# Patient Record
Sex: Male | Born: 1965 | Race: Black or African American | Hispanic: No | Marital: Single | Smoking: Never smoker
Health system: Southern US, Community
[De-identification: ages and names within clinical notes are randomized; demographics above are authoritative.]

## PROBLEM LIST (undated history)

## (undated) DIAGNOSIS — I4892 Unspecified atrial flutter: Secondary | ICD-10-CM

## (undated) DIAGNOSIS — I499 Cardiac arrhythmia, unspecified: Secondary | ICD-10-CM

## (undated) DIAGNOSIS — I10 Essential (primary) hypertension: Secondary | ICD-10-CM

## (undated) HISTORY — PX: WISDOM TOOTH EXTRACTION: SHX21

## (undated) HISTORY — PX: OTHER SURGICAL HISTORY: SHX169

## (undated) HISTORY — DX: Unspecified atrial flutter: I48.92

---

## 2013-08-05 ENCOUNTER — Encounter (HOSPITAL_COMMUNITY): Payer: Self-pay | Admitting: Emergency Medicine

## 2013-08-05 ENCOUNTER — Emergency Department (HOSPITAL_COMMUNITY): Payer: BC Managed Care – PPO

## 2013-08-05 ENCOUNTER — Emergency Department (HOSPITAL_COMMUNITY)
Admission: EM | Admit: 2013-08-05 | Discharge: 2013-08-05 | Disposition: A | Discharge status: Home / Self Care | Payer: BC Managed Care – PPO | Attending: Emergency Medicine | Admitting: Emergency Medicine

## 2013-08-05 DIAGNOSIS — R002 Palpitations: Secondary | ICD-10-CM

## 2013-08-05 DIAGNOSIS — I4892 Unspecified atrial flutter: Secondary | ICD-10-CM

## 2013-08-05 DIAGNOSIS — Z7982 Long term (current) use of aspirin: Secondary | ICD-10-CM | POA: Insufficient documentation

## 2013-08-05 DIAGNOSIS — Z79899 Other long term (current) drug therapy: Secondary | ICD-10-CM | POA: Insufficient documentation

## 2013-08-05 DIAGNOSIS — Z8679 Personal history of other diseases of the circulatory system: Secondary | ICD-10-CM | POA: Insufficient documentation

## 2013-08-05 LAB — BASIC METABOLIC PANEL
BUN: 12 mg/dL (ref 6–23)
CALCIUM: 10.2 mg/dL (ref 8.4–10.5)
CO2: 23 mEq/L (ref 19–32)
Chloride: 99 mEq/L (ref 96–112)
Creatinine, Ser: 0.93 mg/dL (ref 0.50–1.35)
Glucose, Bld: 96 mg/dL (ref 70–99)
POTASSIUM: 3.7 meq/L (ref 3.7–5.3)
SODIUM: 141 meq/L (ref 137–147)

## 2013-08-05 LAB — POCT I-STAT TROPONIN I: TROPONIN I, POC: 0.01 ng/mL (ref 0.00–0.08)

## 2013-08-05 LAB — CBC
HCT: 41.1 % (ref 39.0–52.0)
Hemoglobin: 14.7 g/dL (ref 13.0–17.0)
MCH: 34.7 pg — ABNORMAL HIGH (ref 26.0–34.0)
MCHC: 35.8 g/dL (ref 30.0–36.0)
MCV: 96.9 fL (ref 78.0–100.0)
Platelets: 292 10*3/uL (ref 150–400)
RBC: 4.24 MIL/uL (ref 4.22–5.81)
RDW: 11.6 % (ref 11.5–15.5)
WBC: 7.8 10*3/uL (ref 4.0–10.5)

## 2013-08-05 LAB — PRO B NATRIURETIC PEPTIDE: Pro B Natriuretic peptide (BNP): 394.5 pg/mL — ABNORMAL HIGH (ref 0–125)

## 2013-08-05 NOTE — ED Notes (Signed)
Pt reports hx atrial fibrillation and today he states he felt his heart go back into atrial fib. He checked and his PR was 140. Denies pain, breathing easily, a&ox4

## 2013-08-05 NOTE — ED Provider Notes (Signed)
CSN: [id number]     Arrival date & time 08/05/13  1426 History   First MD Initiated Contact with Patient 08/05/13 1645     Chief Complaint  Patient presents with  . Atrial Fibrillation   (Consider location/radiation/quality/duration/timing/severity/associated sxs/prior Treatment) Patient is a 48 y.o. male presenting with atrial fibrillation. The history is provided by the patient.  Atrial Fibrillation Pertinent negatives include no chest pain, no abdominal pain, no headaches and no shortness of breath.  pt c/o onset palpitations earlier today. Was constant, now improved/resolved. States hx afib, felt same. No current or recent cp or discomfort of any sort. No unusual doe or fatigue. No diaphoresis or nv.  No syncope/fainting. States prior when had afib had seen unspec cardiologist who is now retired, states no specific dx/cause made. No recent heat intolerance, sweats, skin changes, swelling, wt loss, or hx thyroid disease. Denies excessive caffeine or any stimulant use. No cocaine or etoh abuse. States recent health has been at baseline, had been completely asymptomatic prior to onset above symptoms.     Past Medical History  Diagnosis Date  . Atrial fibrillation    History reviewed. No pertinent past surgical history. History reviewed. No pertinent family history. History  Substance Use Topics  . Smoking status: Never Smoker   . Smokeless tobacco: Not on file  . Alcohol Use: Yes    Review of Systems  Constitutional: Negative for fever.  HENT: Negative for sore throat.   Eyes: Negative for redness.  Respiratory: Negative for cough and shortness of breath.   Cardiovascular: Negative for chest pain.  Gastrointestinal: Negative for nausea, vomiting, abdominal pain and diarrhea.  Genitourinary: Negative for dysuria and flank pain.  Musculoskeletal: Negative for back pain and neck pain.  Skin: Negative for rash.  Neurological: Negative for syncope, weakness, numbness and headaches.   Hematological: Does not bruise/bleed easily.  Psychiatric/Behavioral: Negative for confusion.    Allergies  Review of patient's allergies indicates no known allergies.  Home Medications   Current Outpatient Rx  Name  Route  Sig  Dispense  Refill  . Ascorbic Acid (VITAMIN C PO)   Oral   Take 1 capsule by mouth at bedtime.         Marland Kitchen. aspirin EC 81 MG tablet   Oral   Take 81 mg by mouth daily.         . Flaxseed, Linseed, (FLAX SEEDS PO)   Oral   Take 1 tablet by mouth at bedtime.         Marland Kitchen. lisinopril-hydrochlorothiazide (PRINZIDE,ZESTORETIC) 10-12.5 MG per tablet   Oral   Take 1 tablet by mouth daily.         . Nebivolol HCl (BYSTOLIC) 20 MG TABS   Oral   Take 20 mg by mouth daily.         . Omega-3 Fatty Acids (FISH OIL PO)   Oral   Take 1 capsule by mouth at bedtime.         . Red Yeast Rice Extract (RED YEAST RICE PO)   Oral   Take 2 tablets by mouth at bedtime.          BP 153/112  Pulse 61  Temp(Src) 97.9 F (36.6 C) (Oral)  Resp 18  Ht 5\' 10"  (1.778 m)  Wt 175 lb (79.379 kg)  BMI 25.11 kg/m2  SpO2 100% Physical Exam  Nursing note and vitals reviewed. Constitutional: He is oriented to person, place, and time. He appears well-developed and well-nourished. No distress.  HENT:  Nose: Nose normal.  Mouth/Throat: Oropharynx is clear and moist.  Eyes: Conjunctivae are normal. No scleral icterus.  Neck: Neck supple. No tracheal deviation present. No thyromegaly present.  Cardiovascular: Normal rate, regular rhythm, normal heart sounds and intact distal pulses.  Exam reveals no gallop and no friction rub.   No murmur heard. Pulmonary/Chest: Effort normal and breath sounds normal. No accessory muscle usage. No respiratory distress.  Abdominal: Soft. Bowel sounds are normal. He exhibits no distension and no mass. There is no tenderness. There is no rebound and no guarding.  Musculoskeletal: Normal range of motion. He exhibits no edema and no  tenderness.  Neurological: He is alert and oriented to person, place, and time. No cranial nerve deficit.  Steady gait  Skin: Skin is warm and dry. He is not diaphoretic.  Psychiatric: He has a normal mood and affect.    ED Course  Procedures (including critical care time)  Results for orders placed during the hospital encounter of 08/05/13  PRO B NATRIURETIC PEPTIDE      Result Value Range   Pro B Natriuretic peptide (BNP) 394.5 (*) 0 - 125 pg/mL  BASIC METABOLIC PANEL      Result Value Range   Sodium 141  137 - 147 mEq/L   Potassium 3.7  3.7 - 5.3 mEq/L   Chloride 99  96 - 112 mEq/L   CO2 23  19 - 32 mEq/L   Glucose, Bld 96  70 - 99 mg/dL   BUN 12  6 - 23 mg/dL   Creatinine, Ser 4.09  0.50 - 1.35 mg/dL   Calcium 81.1  8.4 - 91.4 mg/dL   GFR calc non Af Amer >90  >90 mL/min   GFR calc Af Amer >90  >90 mL/min  CBC      Result Value Range   WBC 7.8  4.0 - 10.5 K/uL   RBC 4.24  4.22 - 5.81 MIL/uL   Hemoglobin 14.7  13.0 - 17.0 g/dL   HCT 78.2  95.6 - 21.3 %   MCV 96.9  78.0 - 100.0 fL   MCH 34.7 (*) 26.0 - 34.0 pg   MCHC 35.8  30.0 - 36.0 g/dL   RDW 08.6  57.8 - 46.9 %   Platelets 292  150 - 400 K/uL  POCT I-STAT TROPONIN I      Result Value Range   Troponin i, poc 0.01  0.00 - 0.08 ng/mL   Comment 3              EKG Interpretation    Date/Time:  Thursday August 05 2013 14:39:16 EST Ventricular Rate:  108 PR Interval:    QRS Duration: 78 QT Interval:  344 QTC Calculation: 460 R Axis:   90 Text Interpretation:  Atrial flutter with variable A-V block Rightward axis Non-specific ST-t changes Left ventricular hypertrophy No previous tracing Confirmed by Kylin Genna  MD, Alfonza Toft ([id number]) on 08/05/2013 4:55:00 PM            MDM  Iv ns. Continuous pulse ox and monitor. o2 [place].  Labs.  Reviewed nursing notes and prior charts for additional history.   Pt remains in nsr on monitor (after initial ecg w aflutter).  On additional rechecks, remains in nsr, and  asymptomatic.   Pt appears stable for d/c.  rec card follow up, possible outpt monitor.    Repeat ecg:  Date: 08/05/2013  Rate: 54  Rhythm: sinus bradycardia  QRS Axis: normal  Intervals: normal  ST/T Wave abnormalities: normal  Conduction Disutrbances:lvh  Narrative Interpretation:   Old EKG Reviewed: changes noted     Suzi Roots, MD 08/05/13 1810

## 2013-08-05 NOTE — Discharge Instructions (Signed)
Avoid caffeine, energy drinks, or any stimulant use. Follow up with cardiologist in coming week - see referral - call office tomorrow morning to arrange appointment time. Return to ER if worse, persistent fast or irregular heartbeat, weak/faint, chest pain, trouble breathing, other concern.      Atrial Flutter Atrial flutter is a heart rhythm that can cause the heart to beat very fast (tachycardia). It originates in the upper chambers of the heart (atria). In atrial flutter, the top chambers of the heart (atria) often beat much faster than the bottom chambers of the heart (ventricles). Atrial flutter has a regular "saw toothed" appearance in an EKG readout. An EKG is a test that records the electrical activity of the heart. Atrial flutter can cause the heart to beat up to 150 beats per minute (BPM). Atrial flutter can either be short lived (paroxysmal) or permanent.  CAUSES  Causes of atrial flutter can be many. Some of these include:  Heart related issues:  Heart attack (myocardial infarction).  Heart failure.  Heart valve problems.  Poorly controlled high blood pressure (hypertension).  Afteropen heart surgery.  Lung related issues:  A blood clot in the lungs (pulmonary embolism).  Chronic obstructive pulmonary disease (COPD). Medications used to treat COPD can attribute to atrial flutter.  Other related causes:  Hyperthyroidism.  Caffeine.  Some decongestant cold medications.  Low electrolyte levels such as potassium or magnesium.  Cocaine. SYMPTOMS  An awareness of your heart beating rapidly (palpitations).  Shortness of breath.  Chest pain.  Low blood pressure (hypotension).  Dizziness or fainting. DIAGNOSIS  Different tests can be performed to diagnose atrial flutter.   An EKG.  Holter monitor. This is a 24 hour recording of your heart rhythm. You will also be given a diary. Write down all symptoms that you have and what you were doing at the time you  experienced symptoms.  Cardiac event monitor. This small device can be worn for up to 30 days. When you have heart symptoms, you will push a button on the device. This will then record your heart rhythm.  Echocardiogram. This is an imaging test to look at your heart. Your caregiver will look at your heart valves and the ventricles.  Stress Test. This test can help determine if the atrial flutter is related to exercise or if coronary artery disease is present.  Laboratory studies will look at certain blood levels like:  Complete blood count (CBC).  Potassium.  Magnesium.  Thyroid function. TREATMENT  Treatment of atrial flutter varies. A combination of therapies may be used or sometimes atrial flutter may need only 1 type of treatment.  Lab work: If your blood work, such as your electrolytes (potassium, magnesium) or your thyroid function tests are abnormal, your caregiver will treat them accordingly.  Medication:  There are several different types of medications that can convert your heart to a normal rhythm and prevent atrial flutter from reoccurring.  Nonsurgical procedures: Nonsurgical techniques may be used to control atrial flutter. Some examples include:  Cardioversion. This technique uses either drugs or an electrical shock to restore a normal heart rhythm:  Cardioversion drugs may be given through an intravenous (IV) line to help "reset" the heart rhythm.  In electrical cardioversion, your caregiver shocks your heart with electrical energy. This helps to reset the heartbeat to a normal rhythm.  Ablation. If atrial flutter is a persistent problem, an ablation may be needed. This procedure is done under mild sedation. High frequency radio-wave energy is used  to destroy the area of heart tissue responsible for atrial flutter. SEEK IMMEDIATE MEDICAL CARE IF:   Dizziness.  Near fainting or fainting.  Shortness of breath.  Chest pain or pressure.  Sudden nausea or  vomiting.  Profuse sweating. If you have the above symptoms, call your local emergency service immediately! Do not drive yourself to the hospital. MAKE SURE YOU:   Understand these instructions.  Will watch your condition.  Will get help right away if you are not doing well or get worse. Document Released: 11/24/2008 Document Revised: 09/30/2011 Document Reviewed: 11/24/2008 [facility] Patient Information 2014 [place], [place].    Atrial Fibrillation Atrial fibrillation is a type of irregular heart rhythm (arrhythmia). During atrial fibrillation, the upper chambers of the heart (atria) quiver continuously in a chaotic pattern. This causes an irregular and often rapid heart rate.  Atrial fibrillation is the result of the heart becoming overloaded with disorganized signals that tell it to beat. These signals are normally released one at a time by a part of the right atrium called the sinoatrial node. They then travel from the atria to the lower chambers of the heart (ventricles), causing the atria and ventricles to contract and pump blood as they pass. In atrial fibrillation, parts of the atria outside of the sinoatrial node also release these signals. This results in two problems. First, the atria receive so many signals that they do not have time to fully contract. Second, the ventricles, which can only receive one signal at a time, beat irregularly and out of rhythm with the atria.  There are three types of atrial fibrillation:   Paroxysmal Paroxysmal atrial fibrillation starts suddenly and stops on its own within a week.   Persistent Persistent atrial fibrillation lasts for more than a week. It may stop on its own or with treatment.   Permanent Permanent atrial fibrillation does not go away. Episodes of atrial fibrillation may lead to permanent atrial fibrillation.  Atrial fibrillation can prevent your heart from pumping blood normally. It increases your risk of stroke and can lead to  heart failure.  CAUSES   Heart conditions, including a heart attack, heart failure, coronary artery disease, and heart valve conditions.   Inflammation of the sac that surrounds the heart (pericarditis).   Blockage of an artery in the lungs (pulmonary embolism).   Pneumonia or other infections.   Chronic lung disease.   Thyroid problems, especially if the thyroid is overactive (hyperthyroidism).   Caffeine, excessive alcohol use, and use of some illegal drugs.   Use of some medications, including certain decongestants and diet pills.   Heart surgery.   Birth defects.  Sometimes, no cause can be found. When this happens, the atrial fibrillation is called lone atrial fibrillation. The risk of complications from atrial fibrillation increases if you have lone atrial fibrillation and you are age 24 years or older. RISK FACTORS  Heart failure.  Coronary artery disease  Diabetes mellitus.   High blood pressure (hypertension).   Obesity.   Other arrhythmias.   Increased age. SYMPTOMS   A feeling that your heart is beating rapidly or irregularly.   A feeling of discomfort or pain in your chest.   Shortness of breath.   Sudden lightheadedness or weakness.   Getting tired easily when exercising.   Urinating more often than normal (mainly when atrial fibrillation first begins).  In paroxysmal atrial fibrillation, symptoms may start and suddenly stop. DIAGNOSIS  Your caregiver may be able to detect atrial fibrillation when taking  your pulse. Usually, testing is needed to diagnosis atrial fibrillation. Tests may include:   Electrocardiography. During this test, the electrical impulses of your heart are recorded while you are lying down.   Echocardiography. During echocardiography, sound waves are used to evaluate how blood flows through your heart.   Stress test. There is more than one type of stress test. If a stress test is needed, ask your  caregiver about which type is best for you.   Chest X-ray exam.   Blood tests.   Computed tomography (CT).  TREATMENT   Treating any underlying conditions. For example, if you have an overactive thyroid, treating the condition may correct atrial fibrillation.   Medication. Medications may be given to control a rapid heart rate or to prevent blood clots, heart failure, or a stroke.   Procedure to correct the rhythm of the heart:  Electrical cardioversion. During electrical cardioversion, a controlled, low-energy shock is delivered to the heart through your skin. If you have chest pain, very low pressure blood pressure, or sudden heart failure, this procedure may need to be done as an emergency.  Catheter ablation. During this procedure, heart tissues that send the signals that cause atrial fibrillation are destroyed.  Maze or minimaze procedure. During this surgery, thin lines of heart tissue that carry the abnormal signals are destroyed. The maze procedure is an open-heart surgery. The minimaze procedure is a minimally invasive surgery. This means that small cuts are made to access the heart instead of a large opening.  Pulmonary venous isolation. During this surgery, tissue around the veins that carry blood from the lungs (pulmonary veins) is destroyed. This tissue is thought to carry the abnormal signals. HOME CARE INSTRUCTIONS   Take medications as directed by your caregiver.  Only take medications that your caregiver approves. Some medications can make atrial fibrillation worse or recur.  If blood thinners were prescribed by your caregiver, take them exactly as directed. Too much can cause bleeding. Too little and you will not have the needed protection against stroke and other problems.  Perform blood tests at home if directed by your caregiver.  Perform blood tests exactly as directed.   Quit smoking if you smoke.   Do not drink alcohol.   Do not drink caffeinated  beverages such as coffee, soda, and some teas. You may drink decaffeinated coffee, soda, or tea.   Maintain a healthy weight. Do not use diet pills unless your caregiver approves. They may make heart problems worse.   Follow diet instructions as directed by your caregiver.   Exercise regularly as directed by your caregiver.   Keep all follow-up appointments. PREVENTION  The following substances can cause atrial fibrillation to recur:   Caffeinated beverages.   Alcohol.   Certain medications, especially those used for breathing problems.   Certain herbs and herbal medications, such as those containing ephedra or ginseng.  Illegal drugs such as cocaine and amphetamines. Sometimes medications are given to prevent atrial fibrillation from recurring. Proper treatment of any underlying condition is also important in helping prevent recurrence.  SEEK MEDICAL CARE IF:  You notice a change in the rate, rhythm, or strength of your heartbeat.   You suddenly begin urinating more frequently.   You tire more easily when exerting yourself or exercising.  SEEK IMMEDIATE MEDICAL CARE IF:   You develop chest pain, abdominal pain, sweating, or weakness.  You feel sick to your stomach (nauseous).  You develop shortness of breath.  You suddenly develop swollen  feet and ankles.  You feel dizzy.  You face or limbs feel numb or weak.  There is a change in your vision or speech. MAKE SURE YOU:   Understand these instructions.  Will watch your condition.  Will get help right away if you are not doing well or get worse. Document Released: 07/08/2005 Document Revised: 11/02/2012 Document Reviewed: 08/18/2012 [facility] Patient Information 2014 [place], [place].

## 2013-08-05 NOTE — ED Notes (Signed)
Pt cardiac rhythm NSR at this time.   Pt st's he could feel his heart when he went into  A-fib.  St's he did not have any pain but could feels palpitations.

## 2014-07-06 ENCOUNTER — Ambulatory Visit: Payer: Self-pay

## 2014-07-06 ENCOUNTER — Other Ambulatory Visit: Payer: Self-pay | Admitting: Occupational Medicine

## 2014-07-06 DIAGNOSIS — M545 Low back pain, unspecified: Secondary | ICD-10-CM

## 2014-08-24 ENCOUNTER — Ambulatory Visit: Payer: Self-pay | Admitting: Orthopedic Surgery

## 2014-08-24 NOTE — H&P (Signed)
Anthony James is an 49 y.o. male.   Chief Complaint: back and B/L leg pain HPI: The patient is a 49 year old male who presents today for follow up of their back. The patient is being followed for their low back pain. They are now 5 month(s) out from injury (DOI 03/09/2014). Symptoms reported today include: pain. Current treatment includes: relative rest and activity modification. The following medication has been used for pain control: none. The patient presents today following MRI. Note for "Follow-up back": The patient currently has light duty restrictions of no lifting over 10 lbs, no bending, stooping, squatting and no prolonged standing or sitting. The patient is out of work due to no light duty available.  Anthony follows up. He is still having pain down into the right leg and occasionally down the left. He is now five months status post. he intermittently has groin pain.  Past Medical History  Diagnosis Date  . Atrial fibrillation     No past surgical history on file.  No family history on file. Social History:  reports that he has never smoked. He does not have any smokeless tobacco history on file. He reports that he drinks alcohol. He reports that he does not use illicit drugs.  Allergies: No Known Allergies   (Not in a hospital admission)  No results found for this or any previous visit (from the past 48 hour(s)). No results found.  Review of Systems  Constitutional: Negative.   HENT: Negative.   Eyes: Negative.   Respiratory: Negative.   Cardiovascular: Negative.   Gastrointestinal: Negative.   Genitourinary: Negative.   Musculoskeletal: Positive for back pain.  Skin: Negative.   Neurological: Positive for focal weakness.  Psychiatric/Behavioral: Negative.     There were no vitals taken for this visit. Physical Exam  Constitutional: He is oriented to person, place, and time. He appears well-developed and well-nourished.  HENT:  Head: Normocephalic and atraumatic.   Eyes: Conjunctivae and EOM are normal. Pupils are equal, round, and reactive to light.  Neck: Normal range of motion. Neck supple.  Cardiovascular: Normal rate and regular rhythm.   Respiratory: Effort normal and breath sounds normal.  GI: Soft. Bowel sounds are normal.  Musculoskeletal:  On exam, he is upright in minimal distress. Mood and affect appropriate. Straight leg raise on the right produces buttock, thigh, and calf pain and on the left buttock pain. He has trace EHL weakness on the right compared to the left. She is limited to extension of the lumbar spine and slightly limited flexion. Lumbar spine exam reveals no evidence of soft tissue swelling, ecchymosis or deformity. On palpation there is no flank pain with percussion. Nontender over the trochanters.  Neurological: He is alert and oriented to person, place, and time. He has normal reflexes.  Skin: Skin is warm and dry.  Psychiatric: He has a normal mood and affect.    MRI demonstrates a disc herniation paracentral to the right compressing the five root with severe central canal stenosis secondary to the disc herniation as well as congenitally short pedicles. 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 Small disc herniation at 2-3 to the right. It does not appear to be compressing the three root.  Assessment/Plan 1. 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 2. 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  I had extensive discussion with Anthony James concerning current pathology, relevant anatomy and treatment options. At this point  in time the patient is five months status post his injury and has had extensive conservative treatment. He is still limited in terms of how much walking the radicular pain that he has and slight weakness. Given the finding of the severe stenosis with the disc herniation and neural compressive lesion  we discussed options at this point of living with his symptoms verses epidural series verses a lumbar decompression. I do not feel an epidural steroid series would be particularly beneficial in the presence of a neurologic deficit and a disc herniation as well as the stenosis. We mutually agreed to proceed with a lumbar decompression. I have recommended a decompression at 4-5 and a microdiscectomy. I had an extensive discussion of the risks and benefits of the lumbar decompression with the patient including bleeding, infection, damage to neurovascular structures, epidural fibrosis, CSF leak requiring repair. We also discussed increase in pain, adjacent segment disease, recurrent disc herniation, need for future surgery including repeat decompression and/or fusion. We also discussed risks of postoperative hematoma, paralysis, anesthetic complications including DVT, PE, death, or cardiopulmonary dysfunction. In addition, the perioperative and postoperative courses were discussed in detail including the rehabilitative time and return to functional activity and work. I provided the patient with an illustrated handout and utilized the appropriate surgical models. It may be wise to consider the decompression bilaterally at 4-5 given the straight leg raise being positive on the left and his underlying symptoms. We discussed overnight in the hospital, in two weeks suture removal, six weeks of physical therapy followed by 4-6 weeks of work conditioning to achieve maximum medical improvement. I did review his outside medical records in terms of his physical therapy. Again, he has been out of work. He is not working light duties. It has been five months since his injury and given the findings of the MRI I do feel to improve his functional capacity we will proceed at this point in time with a lumbar decompression.  Plan microlumbar decompression L4-5  Anthony James M. PA-C for Dr. Shelle IronBeane 08/24/2014, 5:11  PM

## 2014-09-01 ENCOUNTER — Encounter (HOSPITAL_COMMUNITY): Payer: Self-pay

## 2014-09-01 ENCOUNTER — Ambulatory Visit (HOSPITAL_COMMUNITY)
Admission: RE | Admit: 2014-09-01 | Discharge: 2014-09-01 | Disposition: A | Discharge status: Home / Self Care | Payer: Worker's Compensation | Source: Ambulatory Visit | Attending: Orthopedic Surgery | Admitting: Orthopedic Surgery

## 2014-09-01 ENCOUNTER — Encounter (HOSPITAL_COMMUNITY)
Admission: RE | Admit: 2014-09-01 | Discharge: 2014-09-01 | Disposition: A | Discharge status: Home / Self Care | Payer: Worker's Compensation | Source: Ambulatory Visit | Attending: Specialist | Admitting: Specialist

## 2014-09-01 DIAGNOSIS — Z01818 Encounter for other preprocedural examination: Secondary | ICD-10-CM | POA: Insufficient documentation

## 2014-09-01 DIAGNOSIS — M5126 Other intervertebral disc displacement, lumbar region: Secondary | ICD-10-CM

## 2014-09-01 HISTORY — DX: Cardiac arrhythmia, unspecified: I49.9

## 2014-09-01 LAB — CBC
HCT: 39.2 % (ref 39.0–52.0)
Hemoglobin: 13.4 g/dL (ref 13.0–17.0)
MCH: 33.6 pg (ref 26.0–34.0)
MCHC: 34.2 g/dL (ref 30.0–36.0)
MCV: 98.2 fL (ref 78.0–100.0)
PLATELETS: 284 10*3/uL (ref 150–400)
RBC: 3.99 MIL/uL — AB (ref 4.22–5.81)
RDW: 11.8 % (ref 11.5–15.5)
WBC: 7.3 10*3/uL (ref 4.0–10.5)

## 2014-09-01 LAB — BASIC METABOLIC PANEL
Anion gap: 8 (ref 5–15)
BUN: 10 mg/dL (ref 6–23)
CALCIUM: 9.4 mg/dL (ref 8.4–10.5)
CO2: 26 mmol/L (ref 19–32)
Chloride: 103 mmol/L (ref 96–112)
Creatinine, Ser: 1.07 mg/dL (ref 0.50–1.35)
GFR calc Af Amer: >90 mL/min (ref >90)
GFR calc non Af Amer: 81 mL/min — ABNORMAL LOW (ref >90)
GLUCOSE: 105 mg/dL — AB (ref 70–99)
Potassium: 4.6 mmol/L (ref 3.5–5.1)
Sodium: 137 mmol/L (ref 135–145)

## 2014-09-01 LAB — SURGICAL PCR SCREEN
MRSA, PCR: NEGATIVE
STAPHYLOCOCCUS AUREUS: NEGATIVE

## 2014-09-01 NOTE — Patient Instructions (Signed)
20 Anthony James  09/01/2014   Your procedure is scheduled on:       Wednesday September 07, 2014  [facility] and follow signs to  Short Stay Center arrive at 0900 AM.   Call this number if you have problems the morning of surgery [phone] or Presurgical Testing [phone].   Remember:  Do not eat food or drink liquids :After Midnight.      Take these medicines the morning of surgery with A SIP OF WATER: NONE                               You may not have any metal on your body including hair pins and piercings  Do not wear jewelry,colognes, lotions, powders, or deodorant.  Men may shave face and neck.               Do not bring valuables to the hospital. [facility] IS NOT RESPONSIBLE FOR VALUABLES.  Contacts, dentures or bridgework may not be worn into surgery.  Leave suitcase in the car. After surgery it may be brought to your room.  For patients admitted to the hospital, checkout time is 11:00 AM the day of discharge.      Special Instructions: review fact sheets for Incentive Spirometry.  [facility] - Preparing for Surgery Before surgery, you can play an important role.  Because skin is not sterile, your skin needs to be as free of germs as possible.  You can reduce the number of germs on your skin by washing with CHG (chlorahexidine gluconate) soap before surgery.  CHG is an antiseptic cleaner which kills germs and bonds with the skin to continue killing germs even after washing. Please DO NOT use if you have an allergy to CHG or antibacterial soaps.  If your skin becomes reddened/irritated stop using the CHG and inform your nurse when you arrive at Short Stay. Do not shave (including legs and underarms) for at least 48 hours prior to the first CHG shower.  You may shave your face/neck. Please follow these instructions carefully:  1.  Shower with CHG Soap the night before surgery and the  morning of Surgery.  2.  If you choose to wash your  hair, wash your hair first as usual with your  normal  shampoo.  3.  After you shampoo, rinse your hair and body thoroughly to remove the  shampoo.                           4.  Use CHG as you would any other liquid soap.  You can apply chg directly  to the skin and wash                       Gently with a scrungie or clean washcloth.  5.  Apply the CHG Soap to your body ONLY FROM THE NECK DOWN.   Do not use on face/ open                           Wound or open sores. Avoid contact with eyes, ears mouth and genitals (private parts).                       Wash face,  Genitals (private parts) with your normal soap.  6.  Wash thoroughly, paying special attention to the area where your surgery  will be performed.  7.  Thoroughly rinse your body with warm water from the neck down.  8.  DO NOT shower/wash with your normal soap after using and rinsing off  the CHG Soap.                9.  Pat yourself dry with a clean towel.            10.  Wear clean pajamas.            11.  Place clean sheets on your bed the night of your first shower and do not  sleep with pets. Day of Surgery : Do not apply any lotions/deodorants the morning of surgery.  Please wear clean clothes to the hospital/surgery center.  FAILURE TO FOLLOW THESE INSTRUCTIONS MAY RESULT IN THE CANCELLATION OF YOUR SURGERY PATIENT SIGNATURE_________________________________  NURSE SIGNATURE__________________________________  ________________________________________________________________________   Anthony James  An incentive spirometer is a tool that can help keep your lungs clear and active. This tool measures how well you are filling your lungs with each breath. Taking long deep breaths may help reverse or decrease the chance of developing breathing (pulmonary) problems (especially infection) following:  A long period of time when you are unable to move or be active. BEFORE THE PROCEDURE   If the spirometer  includes an indicator to show your best effort, your nurse or respiratory therapist will set it to a desired goal.  If possible, sit up straight or lean slightly forward. Try not to slouch.  Hold the incentive spirometer in an upright position. INSTRUCTIONS FOR USE  1. Sit on the edge of your bed if possible, or sit up as far as you can in bed or on a chair. 2. Hold the incentive spirometer in an upright position. 3. Breathe out normally. 4. Place the mouthpiece in your mouth and seal your lips tightly around it. 5. Breathe in slowly and as deeply as possible, raising the piston or the ball toward the top of the column. 6. Hold your breath for 3-5 seconds or for as long as possible. Allow the piston or ball to fall to the bottom of the column. 7. Remove the mouthpiece from your mouth and breathe out normally. 8. Rest for a few seconds and repeat Steps 1 through 7 at least 10 times every 1-2 hours when you are awake. Take your time and take a few normal breaths between deep breaths. 9. The spirometer may include an indicator to show your best effort. Use the indicator as a goal to work toward during each repetition. 10. After each set of 10 deep breaths, practice coughing to be sure your lungs are clear. If you have an incision (the cut made at the time of surgery), support your incision when coughing by placing a pillow or rolled up towels firmly against it. Once you are able to get out of bed, walk around indoors and cough well. You may stop using the incentive spirometer when instructed by your caregiver.  RISKS AND COMPLICATIONS  Take your time so you do not get dizzy or light-headed.  If you are in pain, you may need to take or ask for pain medication before doing incentive spirometry. It is harder to take a deep breath if you are having pain. AFTER USE  Rest and breathe slowly and easily.  It can be helpful to keep track of a log of your  progress. Your caregiver can provide you with a  simple table to help with this. If you are using the spirometer at home, follow these instructions: Anthony James IF:   You are having difficultly using the spirometer.  You have trouble using the spirometer as often as instructed.  Your pain medication is not giving enough relief while using the spirometer.  You develop fever of 100.5 F (38.1 C) or higher. SEEK IMMEDIATE MEDICAL CARE IF:   You cough up bloody sputum that had not been present before.  You develop fever of 102 F (38.9 C) or greater.  You develop worsening pain at or near the incision site. MAKE SURE YOU:   Understand these instructions.  Will watch your condition.  Will get help right away if you are not doing well or get worse. Document Released: 11/18/2006 Document Revised: 09/30/2011 Document Reviewed: 01/19/2007 [facility] Patient Information 2014 [place], [place].   ________________________________________________________________________

## 2014-09-01 NOTE — Progress Notes (Signed)
EKG per chart 08/19/2014

## 2014-09-07 ENCOUNTER — Ambulatory Visit (HOSPITAL_COMMUNITY): Payer: Worker's Compensation | Admitting: Anesthesiology

## 2014-09-07 ENCOUNTER — Ambulatory Visit (HOSPITAL_COMMUNITY): Payer: Worker's Compensation

## 2014-09-07 ENCOUNTER — Ambulatory Visit (HOSPITAL_COMMUNITY)
Admission: RE | Admit: 2014-09-07 | Discharge: 2014-09-08 | Disposition: A | Discharge status: Home / Self Care | Payer: Worker's Compensation | Source: Ambulatory Visit | Attending: Specialist | Admitting: Specialist

## 2014-09-07 ENCOUNTER — Encounter (HOSPITAL_COMMUNITY): Payer: Self-pay | Admitting: *Deleted

## 2014-09-07 ENCOUNTER — Encounter (HOSPITAL_COMMUNITY)
Admission: RE | Disposition: A | Discharge status: Home / Self Care | Payer: Self-pay | Source: Ambulatory Visit | Attending: Specialist

## 2014-09-07 DIAGNOSIS — M5126 Other intervertebral disc displacement, lumbar region: Secondary | ICD-10-CM | POA: Diagnosis present

## 2014-09-07 DIAGNOSIS — I4891 Unspecified atrial fibrillation: Secondary | ICD-10-CM | POA: Insufficient documentation

## 2014-09-07 DIAGNOSIS — M4806 Spinal stenosis, lumbar region: Secondary | ICD-10-CM | POA: Diagnosis not present

## 2014-09-07 DIAGNOSIS — R52 Pain, unspecified: Secondary | ICD-10-CM

## 2014-09-07 DIAGNOSIS — M48061 Spinal stenosis, lumbar region without neurogenic claudication: Secondary | ICD-10-CM | POA: Diagnosis present

## 2014-09-07 HISTORY — PX: LUMBAR LAMINECTOMY/DECOMPRESSION MICRODISCECTOMY: SHX5026

## 2014-09-07 SURGERY — LUMBAR LAMINECTOMY/DECOMPRESSION MICRODISCECTOMY 1 LEVEL
Anesthesia: General | Site: Back

## 2014-09-07 MED ORDER — MAGNESIUM CITRATE PO SOLN: 1.0000 | Freq: Once | ORAL | Status: AC | PRN

## 2014-09-07 MED ORDER — HYDROCHLOROTHIAZIDE 12.5 MG PO CAPS
12.5000 mg | ORAL_CAPSULE | Freq: Every day | ORAL | Status: DC
  Filled 2014-09-07: qty 1

## 2014-09-07 MED ORDER — HYDROMORPHONE HCL 1 MG/ML IJ SOLN: 0.5000 mg | INTRAMUSCULAR | Status: DC | PRN

## 2014-09-07 MED ORDER — ACETAMINOPHEN 650 MG RE SUPP: 650.0000 mg | RECTAL | Status: DC | PRN

## 2014-09-07 MED ORDER — BISACODYL 5 MG PO TBEC: 5.0000 mg | DELAYED_RELEASE_TABLET | Freq: Every day | ORAL | Status: DC | PRN

## 2014-09-07 MED ORDER — GLYCOPYRROLATE 0.2 MG/ML IJ SOLN
INTRAMUSCULAR | Status: DC | PRN
  Administered 2014-09-07: 0.4 mg via INTRAVENOUS

## 2014-09-07 MED ORDER — GLYCOPYRROLATE 0.2 MG/ML IJ SOLN
INTRAMUSCULAR | Status: AC
Start: 2014-09-07 — End: 2014-09-07
  Filled 2014-09-07: qty 2

## 2014-09-07 MED ORDER — MEPERIDINE HCL 50 MG/ML IJ SOLN: 6.2500 mg | INTRAMUSCULAR | Status: DC | PRN

## 2014-09-07 MED ORDER — SODIUM CHLORIDE 0.9 % IJ SOLN: 3.0000 mL | Freq: Two times a day (BID) | INTRAMUSCULAR | Status: DC

## 2014-09-07 MED ORDER — METHOCARBAMOL 500 MG PO TABS: 500.0000 mg | ORAL_TABLET | Freq: Three times a day (TID) | ORAL | Status: DC | PRN

## 2014-09-07 MED ORDER — CEFAZOLIN SODIUM-DEXTROSE 2-3 GM-% IV SOLR
INTRAVENOUS | Status: AC
  Filled 2014-09-07: qty 50

## 2014-09-07 MED ORDER — LIDOCAINE HCL (CARDIAC) 20 MG/ML IV SOLN
INTRAVENOUS | Status: AC
  Filled 2014-09-07: qty 5

## 2014-09-07 MED ORDER — SUCCINYLCHOLINE CHLORIDE 20 MG/ML IJ SOLN
INTRAMUSCULAR | Status: DC | PRN
  Administered 2014-09-07: 100 mg via INTRAVENOUS

## 2014-09-07 MED ORDER — DEXAMETHASONE SODIUM PHOSPHATE 10 MG/ML IJ SOLN
INTRAMUSCULAR | Status: AC
  Filled 2014-09-07: qty 1

## 2014-09-07 MED ORDER — KCL IN DEXTROSE-NACL 20-5-0.45 MEQ/L-%-% IV SOLN
INTRAVENOUS | Status: DC
  Administered 2014-09-07: 20:00:00 via INTRAVENOUS
  Filled 2014-09-07 (×2): qty 1000

## 2014-09-07 MED ORDER — PROPOFOL 10 MG/ML IV BOLUS
INTRAVENOUS | Status: DC | PRN
  Administered 2014-09-07: 180 mg via INTRAVENOUS

## 2014-09-07 MED ORDER — METHOCARBAMOL 1000 MG/10ML IJ SOLN
500.0000 mg | Freq: Four times a day (QID) | INTRAVENOUS | Status: DC | PRN
  Administered 2014-09-07: 500 mg via INTRAVENOUS
  Filled 2014-09-07 (×3): qty 5

## 2014-09-07 MED ORDER — EPHEDRINE SULFATE 50 MG/ML IJ SOLN
INTRAMUSCULAR | Status: DC | PRN
  Administered 2014-09-07: 10 mg via INTRAVENOUS

## 2014-09-07 MED ORDER — THROMBIN 5000 UNITS EX SOLR
CUTANEOUS | Status: DC | PRN
  Administered 2014-09-07: 10 mL via TOPICAL

## 2014-09-07 MED ORDER — LACTATED RINGERS IV SOLN
INTRAVENOUS | Status: DC
  Administered 2014-09-07: 1000 mL via INTRAVENOUS
  Administered 2014-09-07: 11:00:00 via INTRAVENOUS

## 2014-09-07 MED ORDER — NEOSTIGMINE METHYLSULFATE 10 MG/10ML IV SOLN
INTRAVENOUS | Status: DC | PRN
  Administered 2014-09-07: 3 mg via INTRAVENOUS

## 2014-09-07 MED ORDER — DOCUSATE SODIUM 100 MG PO CAPS: 100.0000 mg | ORAL_CAPSULE | Freq: Two times a day (BID) | ORAL | Status: DC | PRN

## 2014-09-07 MED ORDER — FENTANYL CITRATE 0.05 MG/ML IJ SOLN
INTRAMUSCULAR | Status: AC
  Filled 2014-09-07: qty 2

## 2014-09-07 MED ORDER — NEBIVOLOL HCL 10 MG PO TABS
20.0000 mg | ORAL_TABLET | Freq: Every day | ORAL | Status: DC
  Administered 2014-09-07: 20 mg via ORAL
  Filled 2014-09-07 (×2): qty 2

## 2014-09-07 MED ORDER — PROPOFOL 10 MG/ML IV BOLUS
INTRAVENOUS | Status: AC
  Filled 2014-09-07: qty 20

## 2014-09-07 MED ORDER — ACETAMINOPHEN 325 MG PO TABS: 650.0000 mg | ORAL_TABLET | ORAL | Status: DC | PRN

## 2014-09-07 MED ORDER — CEFAZOLIN SODIUM-DEXTROSE 2-3 GM-% IV SOLR
2.0000 g | Freq: Three times a day (TID) | INTRAVENOUS | Status: AC
  Administered 2014-09-07 – 2014-09-08 (×3): 2 g via INTRAVENOUS
  Filled 2014-09-07 (×3): qty 50

## 2014-09-07 MED ORDER — HYDROMORPHONE HCL 1 MG/ML IJ SOLN
0.2500 mg | INTRAMUSCULAR | Status: DC | PRN
  Administered 2014-09-07 (×2): 0.5 mg via INTRAVENOUS

## 2014-09-07 MED ORDER — CEFAZOLIN SODIUM-DEXTROSE 2-3 GM-% IV SOLR
2.0000 g | INTRAVENOUS | Status: AC
  Administered 2014-09-07: 2 g via INTRAVENOUS

## 2014-09-07 MED ORDER — THROMBIN 5000 UNITS EX SOLR
CUTANEOUS | Status: AC
  Filled 2014-09-07: qty 10000

## 2014-09-07 MED ORDER — SENNOSIDES-DOCUSATE SODIUM 8.6-50 MG PO TABS: 1.0000 | ORAL_TABLET | Freq: Every evening | ORAL | Status: DC | PRN

## 2014-09-07 MED ORDER — SODIUM CHLORIDE 0.9 % IJ SOLN: 3.0000 mL | INTRAMUSCULAR | Status: DC | PRN

## 2014-09-07 MED ORDER — PHENOL 1.4 % MT LIQD: 1.0000 | OROMUCOSAL | Status: DC | PRN

## 2014-09-07 MED ORDER — ONDANSETRON HCL 4 MG/2ML IJ SOLN: 4.0000 mg | INTRAMUSCULAR | Status: DC | PRN

## 2014-09-07 MED ORDER — MIDAZOLAM HCL 5 MG/5ML IJ SOLN
INTRAMUSCULAR | Status: DC | PRN
  Administered 2014-09-07: 2 mg via INTRAVENOUS

## 2014-09-07 MED ORDER — FENTANYL CITRATE 0.05 MG/ML IJ SOLN
INTRAMUSCULAR | Status: DC | PRN
  Administered 2014-09-07 (×2): 100 ug via INTRAVENOUS
  Administered 2014-09-07: 50 ug via INTRAVENOUS

## 2014-09-07 MED ORDER — PROMETHAZINE HCL 25 MG/ML IJ SOLN
6.2500 mg | INTRAMUSCULAR | Status: DC | PRN
Start: 2014-09-07 — End: 2014-09-07

## 2014-09-07 MED ORDER — ONDANSETRON HCL 4 MG/2ML IJ SOLN
INTRAMUSCULAR | Status: AC
  Filled 2014-09-07: qty 2

## 2014-09-07 MED ORDER — NEOSTIGMINE METHYLSULFATE 10 MG/10ML IV SOLN
INTRAVENOUS | Status: AC
  Filled 2014-09-07: qty 1

## 2014-09-07 MED ORDER — ROCURONIUM BROMIDE 100 MG/10ML IV SOLN
INTRAVENOUS | Status: DC | PRN
  Administered 2014-09-07: 5 mg via INTRAVENOUS
  Administered 2014-09-07: 25 mg via INTRAVENOUS

## 2014-09-07 MED ORDER — LISINOPRIL 10 MG PO TABS
10.0000 mg | ORAL_TABLET | Freq: Every day | ORAL | Status: DC
  Filled 2014-09-07: qty 1

## 2014-09-07 MED ORDER — METHOCARBAMOL 500 MG PO TABS
500.0000 mg | ORAL_TABLET | Freq: Four times a day (QID) | ORAL | Status: DC | PRN
  Administered 2014-09-08 (×2): 500 mg via ORAL
  Filled 2014-09-07 (×2): qty 1

## 2014-09-07 MED ORDER — BUPIVACAINE-EPINEPHRINE (PF) 0.5% -1:200000 IJ SOLN
INTRAMUSCULAR | Status: AC
  Filled 2014-09-07: qty 30

## 2014-09-07 MED ORDER — FENTANYL CITRATE 0.05 MG/ML IJ SOLN
INTRAMUSCULAR | Status: AC
  Filled 2014-09-07: qty 5

## 2014-09-07 MED ORDER — SODIUM CHLORIDE 0.9 % IV SOLN: 250.0000 mL | INTRAVENOUS | Status: DC

## 2014-09-07 MED ORDER — ONDANSETRON HCL 4 MG/2ML IJ SOLN
INTRAMUSCULAR | Status: DC | PRN
  Administered 2014-09-07: 4 mg via INTRAVENOUS

## 2014-09-07 MED ORDER — SODIUM CHLORIDE 0.9 % IR SOLN
Status: DC | PRN
  Administered 2014-09-07: 500 mL

## 2014-09-07 MED ORDER — OXYCODONE-ACETAMINOPHEN 5-325 MG PO TABS: 1.0000 | ORAL_TABLET | ORAL | Status: DC | PRN

## 2014-09-07 MED ORDER — MENTHOL 3 MG MT LOZG
1.0000 | LOZENGE | OROMUCOSAL | Status: DC | PRN
  Filled 2014-09-07: qty 9

## 2014-09-07 MED ORDER — ALUM & MAG HYDROXIDE-SIMETH 200-200-20 MG/5ML PO SUSP: 30.0000 mL | Freq: Four times a day (QID) | ORAL | Status: DC | PRN

## 2014-09-07 MED ORDER — BUPIVACAINE-EPINEPHRINE 0.5% -1:200000 IJ SOLN
INTRAMUSCULAR | Status: DC | PRN
  Administered 2014-09-07: 10 mL

## 2014-09-07 MED ORDER — HYDROMORPHONE HCL 1 MG/ML IJ SOLN
INTRAMUSCULAR | Status: AC
  Administered 2014-09-07: 14:00:00
  Filled 2014-09-07: qty 1

## 2014-09-07 MED ORDER — DOCUSATE SODIUM 100 MG PO CAPS
100.0000 mg | ORAL_CAPSULE | Freq: Two times a day (BID) | ORAL | Status: DC
  Administered 2014-09-07: 100 mg via ORAL

## 2014-09-07 MED ORDER — RISAQUAD PO CAPS
1.0000 | ORAL_CAPSULE | Freq: Every day | ORAL | Status: DC
  Administered 2014-09-07: 1 via ORAL
  Filled 2014-09-07 (×2): qty 1

## 2014-09-07 MED ORDER — ROCURONIUM BROMIDE 100 MG/10ML IV SOLN
INTRAVENOUS | Status: AC
  Filled 2014-09-07: qty 1

## 2014-09-07 MED ORDER — HYDROCODONE-ACETAMINOPHEN 5-325 MG PO TABS: 1.0000 | ORAL_TABLET | ORAL | Status: DC | PRN

## 2014-09-07 MED ORDER — LIDOCAINE HCL (CARDIAC) 20 MG/ML IV SOLN
INTRAVENOUS | Status: DC | PRN
  Administered 2014-09-07: 50 mg via INTRAVENOUS

## 2014-09-07 MED ORDER — LISINOPRIL-HYDROCHLOROTHIAZIDE 10-12.5 MG PO TABS: 1.0000 | ORAL_TABLET | Freq: Every morning | ORAL | Status: DC

## 2014-09-07 MED ORDER — OXYCODONE-ACETAMINOPHEN 5-325 MG PO TABS
1.0000 | ORAL_TABLET | ORAL | Status: DC | PRN
  Administered 2014-09-07 – 2014-09-08 (×4): 1 via ORAL
  Filled 2014-09-07 (×4): qty 1

## 2014-09-07 MED ORDER — LACTATED RINGERS IV SOLN
INTRAVENOUS | Status: DC
  Administered 2014-09-07: 14:00:00 via INTRAVENOUS

## 2014-09-07 MED ORDER — VITAMIN C 250 MG PO TABS
250.0000 mg | ORAL_TABLET | Freq: Two times a day (BID) | ORAL | Status: DC
  Administered 2014-09-07: 250 mg via ORAL
  Filled 2014-09-07 (×3): qty 1

## 2014-09-07 MED ORDER — DEXAMETHASONE SODIUM PHOSPHATE 10 MG/ML IJ SOLN
INTRAMUSCULAR | Status: DC | PRN
  Administered 2014-09-07: 10 mg via INTRAVENOUS

## 2014-09-07 MED ORDER — MIDAZOLAM HCL 2 MG/2ML IJ SOLN
INTRAMUSCULAR | Status: AC
  Filled 2014-09-07: qty 2

## 2014-09-07 SURGICAL SUPPLY — 45 items
BAG ZIPLOCK 12X15 (MISCELLANEOUS) ×3 IMPLANT
CLEANER TIP ELECTROSURG 2X2 (MISCELLANEOUS) ×3 IMPLANT
CLOSURE WOUND 1/2 X4 (GAUZE/BANDAGES/DRESSINGS) ×1
CLOTH 2% CHLOROHEXIDINE 3PK (PERSONAL CARE ITEMS) ×3 IMPLANT
DRAPE MICROSCOPE LEICA (MISCELLANEOUS) ×3 IMPLANT
DRAPE POUCH INSTRU U-SHP 10X18 (DRAPES) ×3 IMPLANT
DRAPE SURG 17X11 SM STRL (DRAPES) ×3 IMPLANT
DRAPE UTILITY XL STRL (DRAPES) ×3 IMPLANT
DRSG AQUACEL AG ADV 3.5X 4 (GAUZE/BANDAGES/DRESSINGS) ×3 IMPLANT
DRSG AQUACEL AG ADV 3.5X 6 (GAUZE/BANDAGES/DRESSINGS) IMPLANT
DURAPREP 26ML APPLICATOR (WOUND CARE) ×3 IMPLANT
DURASEAL SPINE SEALANT 3ML (MISCELLANEOUS) IMPLANT
ELECT BLADE TIP CTD 4 INCH (ELECTRODE) IMPLANT
ELECT REM PT RETURN 9FT ADLT (ELECTROSURGICAL) ×3
ELECTRODE REM PT RTRN 9FT ADLT (ELECTROSURGICAL) ×1 IMPLANT
GLOVE BIOGEL PI IND STRL 7.5 (GLOVE) ×1 IMPLANT
GLOVE BIOGEL PI INDICATOR 7.5 (GLOVE) ×2
GLOVE SURG SS PI 7.5 STRL IVOR (GLOVE) ×3 IMPLANT
GLOVE SURG SS PI 8.0 STRL IVOR (GLOVE) ×6 IMPLANT
GOWN STRL REUS W/TWL XL LVL3 (GOWN DISPOSABLE) ×6 IMPLANT
IV CATH 14GX2 1/4 (CATHETERS) IMPLANT
KIT BASIN OR (CUSTOM PROCEDURE TRAY) ×3 IMPLANT
KIT POSITIONING SURG ANDREWS (MISCELLANEOUS) ×3 IMPLANT
MANIFOLD NEPTUNE II (INSTRUMENTS) ×3 IMPLANT
NEEDLE SPNL 18GX3.5 QUINCKE PK (NEEDLE) ×6 IMPLANT
PACK LAMINECTOMY ORTHO (CUSTOM PROCEDURE TRAY) ×3 IMPLANT
PATTIES SURGICAL .5 X.5 (GAUZE/BANDAGES/DRESSINGS) ×3 IMPLANT
PATTIES SURGICAL .75X.75 (GAUZE/BANDAGES/DRESSINGS) IMPLANT
PATTIES SURGICAL 1X1 (DISPOSABLE) IMPLANT
RUBBERBAND STERILE (MISCELLANEOUS) ×6 IMPLANT
SPONGE SURGIFOAM ABS GEL 100 (HEMOSTASIS) ×3 IMPLANT
STAPLER VISISTAT (STAPLE) IMPLANT
STRIP CLOSURE SKIN 1/2X4 (GAUZE/BANDAGES/DRESSINGS) ×2 IMPLANT
SUT NURALON 4 0 TR CR/8 (SUTURE) IMPLANT
SUT PROLENE 3 0 PS 2 (SUTURE) ×3 IMPLANT
SUT VIC AB 1 CT1 27 (SUTURE)
SUT VIC AB 1 CT1 27XBRD ANTBC (SUTURE) IMPLANT
SUT VIC AB 1-0 CT2 27 (SUTURE) ×3 IMPLANT
SUT VIC AB 2-0 CT1 27 (SUTURE)
SUT VIC AB 2-0 CT1 TAPERPNT 27 (SUTURE) IMPLANT
SUT VIC AB 2-0 CT2 27 (SUTURE) ×3 IMPLANT
SYR 3ML LL SCALE MARK (SYRINGE) IMPLANT
TOWEL OR 17X26 10 PK STRL BLUE (TOWEL DISPOSABLE) ×3 IMPLANT
TOWEL OR NON WOVEN STRL DISP B (DISPOSABLE) IMPLANT
YANKAUER SUCT BULB TIP NO VENT (SUCTIONS) ×3 IMPLANT

## 2014-09-07 NOTE — H&P (View-Only) (Signed)
Anthony James is an 49 y.o. male.   Chief Complaint: back and B/L leg pain HPI: The patient is a 49 year old male who presents today for follow up of their back. The patient is being followed for their low back pain. They are now 5 month(s) out from injury (DOI 03/09/2014). Symptoms reported today include: pain. Current treatment includes: relative rest and activity modification. The following medication has been used for pain control: none. The patient presents today following MRI. Note for "Follow-up back": The patient currently has light duty restrictions of no lifting over 10 lbs, no bending, stooping, squatting and no prolonged standing or sitting. The patient is out of work due to no light duty available.  Anthony James follows up. He is still having pain down into the right leg and occasionally down the left. He is now five months status post. he intermittently has groin pain.  Past Medical History  Diagnosis Date  . Atrial fibrillation     No past surgical history on file.  No family history on file. Social History:  reports that he has never smoked. He does not have any smokeless tobacco history on file. He reports that he drinks alcohol. He reports that he does not use illicit drugs.  Allergies: No Known Allergies   (Not in a hospital admission)  No results found for this or any previous visit (from the past 48 hour(s)). No results found.  Review of Systems  Constitutional: Negative.   HENT: Negative.   Eyes: Negative.   Respiratory: Negative.   Cardiovascular: Negative.   Gastrointestinal: Negative.   Genitourinary: Negative.   Musculoskeletal: Positive for back pain.  Skin: Negative.   Neurological: Positive for focal weakness.  Psychiatric/Behavioral: Negative.     There were no vitals taken for this visit. Physical Exam  Constitutional: He is oriented to person, place, and time. He appears well-developed and well-nourished.  HENT:  Head: Normocephalic and atraumatic.   Eyes: Conjunctivae and EOM are normal. Pupils are equal, round, and reactive to light.  Neck: Normal range of motion. Neck supple.  Cardiovascular: Normal rate and regular rhythm.   Respiratory: Effort normal and breath sounds normal.  GI: Soft. Bowel sounds are normal.  Musculoskeletal:  On exam, he is upright in minimal distress. Mood and affect appropriate. Straight leg raise on the right produces buttock, thigh, and calf pain and on the left buttock pain. He has trace EHL weakness on the right compared to the left. She is limited to extension of the lumbar spine and slightly limited flexion. Lumbar spine exam reveals no evidence of soft tissue swelling, ecchymosis or deformity. On palpation there is no flank pain with percussion. Nontender over the trochanters.  Neurological: He is alert and oriented to person, place, and time. He has normal reflexes.  Skin: Skin is warm and dry.  Psychiatric: He has a normal mood and affect.    MRI demonstrates a disc herniation paracentral to the right compressing the five root with severe central canal stenosis secondary to the disc herniation as well as congenitally short pedicles. 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 Small disc herniation at 2-3 to the right. It does not appear to be compressing the three root.  Assessment/Plan 1. 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 2. 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  I had extensive discussion with Anthony James concerning current pathology, relevant anatomy and treatment options. At this point   in time the patient is five months status post his injury and has had extensive conservative treatment. He is still limited in terms of how much walking the radicular pain that he has and slight weakness. Given the finding of the severe stenosis with the disc herniation and neural compressive lesion  we discussed options at this point of living with his symptoms verses epidural series verses a lumbar decompression. I do not feel an epidural steroid series would be particularly beneficial in the presence of a neurologic deficit and a disc herniation as well as the stenosis. We mutually agreed to proceed with a lumbar decompression. I have recommended a decompression at 4-5 and a microdiscectomy. I had an extensive discussion of the risks and benefits of the lumbar decompression with the patient including bleeding, infection, damage to neurovascular structures, epidural fibrosis, CSF leak requiring repair. We also discussed increase in pain, adjacent segment disease, recurrent disc herniation, need for future surgery including repeat decompression and/or fusion. We also discussed risks of postoperative hematoma, paralysis, anesthetic complications including DVT, PE, death, or cardiopulmonary dysfunction. In addition, the perioperative and postoperative courses were discussed in detail including the rehabilitative time and return to functional activity and work. I provided the patient with an illustrated handout and utilized the appropriate surgical models. It may be wise to consider the decompression bilaterally at 4-5 given the straight leg raise being positive on the left and his underlying symptoms. We discussed overnight in the hospital, in two weeks suture removal, six weeks of physical therapy followed by 4-6 weeks of work conditioning to achieve maximum medical improvement. I did review his outside medical records in terms of his physical therapy. Again, he has been out of work. He is not working light duties. It has been five months since his injury and given the findings of the MRI I do feel to improve his functional capacity we will proceed at this point in time with a lumbar decompression.  Plan microlumbar decompression L4-5  BISSELL, JACLYN M. PA-C for Dr. Beane 08/24/2014, 5:11  PM    

## 2014-09-07 NOTE — Discharge Instructions (Signed)

## 2014-09-07 NOTE — Interval H&P Note (Signed)
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 After consideration of risks, benefits and other options for treatment, the patient has consented to  Procedure(s): MICRO LUMBAR DECOMPRESSION L4-5 (N/A) as a surgical intervention .  The patient's history has been reviewed, patient examined, no change in status, stable for surgery.  I have reviewed the patient's chart and labs.  Questions were answered to the patient's satisfaction.     Roy Snuffer C

## 2014-09-07 NOTE — Plan of Care (Signed)
Problem: Consults Goal: Diagnosis - Spinal Surgery Outcome: Completed/Met Date Met:  09/07/14 Microdiscectomy  Problem: Phase I Progression Outcomes Goal: Initial discharge plan identified Outcome: Completed/Met Date Met:  09/07/14 Pt plans to go home at discharge and states that he will find someone to assist him at home as he does not have a direct support system.

## 2014-09-07 NOTE — Transfer of Care (Signed)
Immediate Anesthesia Transfer of Care Note  Patient: Anthony James  Procedure(s) Performed: Procedure(s): MICRO LUMBAR DECOMPRESSION L4-5 (N/A)  Patient Location: PACU  Anesthesia Type:General  Level of Consciousness: awake, alert  and oriented  Airway & Oxygen Therapy: Patient Spontanous Breathing and Patient connected to face mask oxygen  Post-op Assessment: Report given to RN and Post -op Vital signs reviewed and stable  Post vital signs: Reviewed and stable  Last Vitals:  Filed Vitals:   09/07/14 0841  BP: 123/84  Pulse: 65  Temp: 36.7 C  Resp: 18    Complications: No apparent anesthesia complications

## 2014-09-07 NOTE — Brief Op Note (Signed)
09/07/2014  12:42 PM  PATIENT:  Anthony LeberEdwin James  49 y.o. male  PRE-OPERATIVE DIAGNOSIS:  HNP/STENOSIS L4-5  POST-OPERATIVE DIAGNOSIS:  HNP/STENOSIS L4-5  PROCEDURE:  Procedure(s): MICRO LUMBAR DECOMPRESSION L4-5 (N/A)  SURGEON:  Surgeon(s) and Role:    * Javier DockerJeffrey C Javeria Briski, MD - Primary  PHYSICIAN ASSISTANT:   ASSISTANTS: Bissell   ANESTHESIA:   general  EBL:  Total I/O In: 1000 [I.V.:1000] Out: 150 [Urine:150]  BLOOD ADMINISTERED:none  DRAINS: none   LOCAL MEDICATIONS USED:  MARCAINE     SPECIMEN:  Source of Specimen:  L45  DISPOSITION OF SPECIMEN:  PATHOLOGY  COUNTS:  YES  TOURNIQUET:  * No tourniquets in log *  DICTATION: .Other Dictation: Dictation Number [id number]  PLAN OF CARE: Admit for overnight observation  PATIENT DISPOSITION:  PACU - hemodynamically stable.   Delay start of Pharmacological VTE agent (>24hrs) due to surgical blood loss or risk of bleeding: yes

## 2014-09-07 NOTE — Anesthesia Preprocedure Evaluation (Signed)
Anesthesia Evaluation  Patient identified by MRN, date of birth, ID band Patient awake    Reviewed: Allergy & Precautions, NPO status , Patient's Chart, lab work & pertinent test results  Airway Mallampati: II  TM Distance: >3 FB Neck ROM: Full    Dental no notable dental hx.    Pulmonary neg pulmonary ROS,  breath sounds clear to auscultation  Pulmonary exam normal       Cardiovascular + dysrhythmias Atrial Fibrillation Rhythm:Regular Rate:Normal     Neuro/Psych negative neurological ROS  negative psych ROS   GI/Hepatic negative GI ROS, Neg liver ROS,   Endo/Other  negative endocrine ROS  Renal/GU negative Renal ROS  negative genitourinary   Musculoskeletal negative musculoskeletal ROS (+)   Abdominal   Peds negative pediatric ROS (+)  Hematology negative hematology ROS (+)   Anesthesia Other Findings   Reproductive/Obstetrics negative OB ROS                             Anesthesia Physical Anesthesia Plan  ASA: II  Anesthesia Plan: General   Post-op Pain Management:    Induction: Intravenous  Airway Management Planned: Oral ETT  Additional Equipment:   Intra-op Plan:   Post-operative Plan: Extubation in OR  Informed Consent: I have reviewed the patients History and Physical, chart, labs and discussed the procedure including the risks, benefits and alternatives for the proposed anesthesia with the patient or authorized representative who has indicated his/her understanding and acceptance.   Dental advisory given  Plan Discussed with: CRNA  Anesthesia Plan Comments:         Anesthesia Quick Evaluation

## 2014-09-08 ENCOUNTER — Encounter (HOSPITAL_COMMUNITY): Payer: Self-pay | Admitting: Specialist

## 2014-09-08 DIAGNOSIS — M5126 Other intervertebral disc displacement, lumbar region: Secondary | ICD-10-CM | POA: Diagnosis not present

## 2014-09-08 LAB — BASIC METABOLIC PANEL
Anion gap: 13 (ref 5–15)
BUN: 9 mg/dL (ref 6–23)
CO2: 24 mmol/L (ref 19–32)
Calcium: 9.4 mg/dL (ref 8.4–10.5)
Chloride: 101 mmol/L (ref 96–112)
Creatinine, Ser: 1.05 mg/dL (ref 0.50–1.35)
GFR calc Af Amer: >90 mL/min (ref >90)
GFR calc non Af Amer: 83 mL/min — ABNORMAL LOW (ref >90)
Glucose, Bld: 128 mg/dL — ABNORMAL HIGH (ref 70–99)
Potassium: 3.9 mmol/L (ref 3.5–5.1)
Sodium: 138 mmol/L (ref 135–145)

## 2014-09-08 MED ORDER — ASPIRIN EC 81 MG PO TBEC: 81.0000 mg | DELAYED_RELEASE_TABLET | Freq: Every day | ORAL | Status: DC

## 2014-09-08 NOTE — Evaluation (Signed)
Occupational Therapy One Time Evaluation Patient Details Name: Lionel Woodberry MRN: [id number] DOB: 1966/03/19 Today's Date: 09/08/2014    History of Present Illness Pt is s/p MICRO LUMBAR DECOMPRESSION L4-5 (N/A)   Clinical Impression   Pt doing very well. Practiced functional transfers including tub and toilet transfer and pt following back precautions. Issued back care handout and reviewed all precautions with pt again. Pt will have intermittent assist at d/c available. No further acute OT needs.    Follow Up Recommendations  No OT follow up;Supervision - Intermittent    Equipment Recommendations  None recommended by OT    Recommendations for Other Services       Precautions / Restrictions Precautions Precautions: Back Precaution Booklet Issued: Yes (comment) Precaution Comments: Issued back care handout with precautions and reviewed all with pt Restrictions Weight Bearing Restrictions: No      Mobility Bed Mobility Overal bed mobility: Modified Independent             General bed mobility comments: log roll technique.  Transfers Overall transfer level: Modified independent Equipment used: None                  Balance                                            ADL Overall ADL's : Modified independent                                       General ADL Comments: Pt moving very well. Educated on all back precautions and how to apply to ADL. Practiced step over a tub using side step technique. Pt already dressed but pt was able to demonstrate crossing LEs up to don pants, socks, etc. Pt has a comfort height commode and was able to sit and stand from commode here without needing UE support. One verbal cue to scoot out to edge of bed before standing to help with keeping back straight. Issued back care handout and reviewed all information. Educated on technique to perform oral care at the sink to adhere to back precautions. Pt  able to reach posterior periarea without breaking precautions and made pt aware to keep back straight with toileting hygiene.      Vision     Perception     Praxis      Pertinent Vitals/Pain Pain Assessment: 0-10 Pain Score: 2  Pain Location: back Pain Descriptors / Indicators: Sore Pain Intervention(s): Repositioned     Hand Dominance     Extremity/Trunk Assessment Upper Extremity Assessment Upper Extremity Assessment: Overall WFL for tasks assessed           Communication Communication Communication: No difficulties   Cognition Arousal/Alertness: Awake/alert Behavior During Therapy: WFL for tasks assessed/performed Overall Cognitive Status: Within Functional Limits for tasks assessed                     General Comments       Exercises       Shoulder Instructions      Home Living Family/patient expects to be discharged to:: Private residence Living Arrangements: Alone Available Help at Discharge: Available PRN/intermittently (states he has someone that can stay today and after today, people that can check in on him intermittently) Type of Home:  House Home Access: Ramped entrance     Home Layout: Two level Alternate Level Stairs-Number of Steps: 13 Alternate Level Stairs-Rails: Right Bathroom Shower/Tub: Chief Strategy OfficerTub/shower unit   Bathroom Toilet:  (comfort height)     Home Equipment: None          Prior Functioning/Environment Level of Independence: Independent             OT Diagnosis: Generalized weakness   OT Problem List:     OT Treatment/Interventions:      OT Goals(Current goals can be found in the care plan section) Acute Rehab OT Goals Patient Stated Goal: home OT Goal Formulation: With patient  OT Frequency:     Barriers to D/C:            Co-evaluation              End of Session    Activity Tolerance: Patient tolerated treatment well Patient left: in chair;with call bell/phone within reach   Time:  0900-0916 OT Time Calculation (min): 16 min Charges:  OT General Charges $OT Visit: 1 Procedure OT Evaluation $Initial OT Evaluation Tier I: 1 Procedure G-Codes: OT G-codes **NOT FOR INPATIENT CLASS** Functional Assessment Tool Used: clinical judgement Functional Limitation: Self care Self Care Current Status (Z6109(G8987): 0 percent impaired, limited or restricted Self Care Goal Status (U0454(G8988): 0 percent impaired, limited or restricted Self Care Discharge Status (U9811(G8989): 0 percent impaired, limited or restricted  Lennox LaityStone, Kiyani Jernigan Stafford [phone] 09/08/2014, 9:23 AM

## 2014-09-08 NOTE — Care Management Note (Signed)
    Page 1 of 1   09/08/2014     11:48:13 AM CARE MANAGEMENT NOTE 09/08/2014  Patient:  Cassetta,Quirino   Account Number:  [id number]  Date Initiated:  09/08/2014  Documentation initiated by:  [facility] Ul[facility]  Subjective/Objective Assessment:   adm: MICRO LUMBAR DECOMPRESSION L4-5 (N/A)      Action/Plan:   home/self care   Anticipated DC Date:  09/08/2014   Anticipated DC Plan:  HOME/SELF CARE      DC Planning Services  CM consult      Choice offered to / List presented to:             Status of service:  Completed, signed off Medicare Important Message given?   (If response is "NO", the following Medicare IM given date fields will be blank) Date Medicare IM given:   Medicare IM given by:   Date Additional Medicare IM given:   Additional Medicare IM given by:    Discharge Disposition:  HOME/SELF CARE  Per UR Regulation:  Reviewed for med. necessity/level of care/duration of stay  If discussed at Long Length of Stay Meetings, dates discussed:    Comments:  09/08/14 CM notes no CM needs were communicated.  Pt home/self care.  Freddy JakschSarah Darcey Demma, BSN, CM [phone].

## 2014-09-08 NOTE — Discharge Summary (Signed)
Physician Discharge Summary   Patient ID: Anthony James MRN: [id number] DOB/AGE: 48-22-1967 48 y.o.  Admit date: 09/07/2014 Discharge date: 09/08/2014  Primary Diagnosis:   HNP/STENOSIS L4-5  Admission Diagnoses:  Past Medical History  Diagnosis Date  . Atrial fibrillation   . Dysrhythmia    Discharge Diagnoses:   Principal Problem:   Spinal stenosis of lumbar region Active Problems:   Spinal stenosis at L4-L5 level  Procedure:  Procedure(s) (LRB): MICRO LUMBAR DECOMPRESSION L4-5 (N/A)   Consults: none  HPI:  see H&P    Laboratory Data: Admission on 08/05/2013, Discharged on 08/05/2013  Component Date Value Ref Range Status  . Pro B Natriuretic peptide (BNP) 08/05/2013 394.5* 0 - 125 pg/mL Final  . Sodium 08/05/2013 141  137 - 147 mEq/L Final  . Potassium 08/05/2013 3.7  3.7 - 5.3 mEq/L Final  . Chloride 08/05/2013 99  96 - 112 mEq/L Final  . CO2 08/05/2013 23  19 - 32 mEq/L Final  . Glucose, Bld 08/05/2013 96  70 - 99 mg/dL Final  . BUN 08/05/2013 12  6 - 23 mg/dL Final  . Creatinine, Ser 08/05/2013 0.93  0.50 - 1.35 mg/dL Final  . Calcium 08/05/2013 10.2  8.4 - 10.5 mg/dL Final  . GFR calc non Af Amer 08/05/2013 >90  >90 mL/min Final  . GFR calc Af Amer 08/05/2013 >90  >90 mL/min Final   Comment: (NOTE)                          The eGFR has been calculated using the CKD EPI equation.                          This calculation has not been validated in all clinical situations.                          eGFR's persistently <90 mL/min signify possible Chronic Kidney                          Disease.  . WBC 08/05/2013 7.8  4.0 - 10.5 K/uL Final  . RBC 08/05/2013 4.24  4.22 - 5.81 MIL/uL Final  . Hemoglobin 08/05/2013 14.7  13.0 - 17.0 g/dL Final  . HCT 08/05/2013 41.1  39.0 - 52.0 % Final  . MCV 08/05/2013 96.9  78.0 - 100.0 fL Final  . MCH 08/05/2013 34.7* 26.0 - 34.0 pg Final  . MCHC 08/05/2013 35.8  30.0 - 36.0 g/dL Final  . RDW 08/05/2013 11.6  11.5 - 15.5 %  Final  . Platelets 08/05/2013 292  150 - 400 K/uL Final  . Troponin i, poc 08/05/2013 0.01  0.00 - 0.08 ng/mL Final  . Comment 3 08/05/2013          Final   Comment: Due to the release kinetics of cTnI,                          a negative result within the first hours                          of the onset of symptoms does not rule out                          myocardial  infarction with certainty.                          If myocardial infarction is still suspected,                          repeat the test at appropriate intervals.   No results for input(s): HGB in the last 72 hours. No results for input(s): WBC, RBC, HCT, PLT in the last 72 hours.  Recent Labs  09/08/14 0425  NA 138  K 3.9  CL 101  CO2 24  BUN 9  CREATININE 1.05  GLUCOSE 128*  CALCIUM 9.4   No results for input(s): LABPT, INR in the last 72 hours.  X-Rays:Dg Lumbar Spine 2-3 Views  09/01/2014   CLINICAL DATA:  49 year old male preoperative study for lumbar surgery. Initial encounter.  EXAM: LUMBAR SPINE - 2-3 VIEW  COMPARISON:  Lumbar radiographs 07/06/2014.  FINDINGS: Standing views. Normal lumbar segmentation. Full size ribs at T12. The lumbar levels on these images were numbered in anticipation of surgery. Stable and normal lumbar vertebral height and alignment. Stable disc spaces. Lumbar bone mineralization is within normal limits. Sacral ala and SI joints appear stable and within normal limits.  Faint 5 mm calcific density projecting just above the right L3 transverse process is new, may be dense enteric contents. Pelvic phleboliths are noted.  Chronic sclerosis and heterogeneous mineralization of the right femoral head re - identified suggesting AVN.  IMPRESSION: Stable radiographic appearance of the lumbar spine. Normal lumbar segmentation and the lumbar levels on these images were numbered in anticipation of surgery.   Electronically Signed   By: Genevie Ann M.D.   On: 09/01/2014 10:02   Dg Spine Portable 1  View  09/07/2014   CLINICAL DATA:  Surgical localization at L4-5  EXAM: PORTABLE SPINE - 1 VIEW  COMPARISON:  Film from earlier in the same day  FINDINGS: A surgical instrument in retractors noted posterior to the L5 vertebral body superiorly. Numbering nomenclature similar to that utilized on the prior exam.  IMPRESSION: Intraoperative localization at the posterior aspect of L5 superiorly.   Electronically Signed   By: Inez Catalina M.D.   On: 09/07/2014 11:57   Dg Spine Portable 1 View  09/07/2014   CLINICAL DATA:  Surgical level L4-5.  EXAM: PORTABLE SPINE - 1 VIEW  COMPARISON:  09/01/2014  FINDINGS: As established on preoperative study, the image was labeled with lumbar spine levels. Needles project at the level of the L4 and L5 spinous processes. No acute or significant change in the lumbar spine since 09/01/2014.  IMPRESSION: Surgical localization at the L4 and L5 levels.   Electronically Signed   By: Monte Fantasia M.D.   On: 09/07/2014 11:34    EKG: Orders placed or performed during the hospital encounter of 08/05/13  . EKG 12-Lead  . EKG 12-Lead  . ED EKG (<40mns upon arrival to the ED)  . ED EKG (<184ms upon arrival to the ED)  . EKG 12-Lead  . EKG 12-Lead  . EKG     Hospital Course: [facility] and underwent the above state procedure without complications.  Patient tolerated the procedure well and was later transferred to the recovery room and then to the orthopaedic floor for postoperative care.  They were given PO and IV analgesics for pain control following their surgery.  They were given 24 hours of postoperative antibiotics.   PT was consulted postop to assist with mobility and transfers.  The patient was allowed to be WBAT with therapy and was taught back precautions. Discharge planning was consulted to help with postop disposition and equipment needs.  Patient had a good night on the evening of surgery and started to get up  OOB with therapy on day one. Patient was seen in rounds and was ready to go home on day one.  They were given discharge instructions and dressing directions.  They were instructed on when to follow up in the office with Dr. Tonita Cong.   Diet: Regular diet Activity:WBAT; Lspine precautions Follow-up:in 10-14 days Disposition - Home Discharged Condition: good   Discharge Instructions    Call MD / Call 911    Complete by:  As directed   If you experience chest pain or shortness of breath, CALL 911 and be transported to the hospital emergency room.  If you develope a fever above 101 F, pus (white drainage) or increased drainage or redness at the wound, or calf pain, call your surgeon's office.     Constipation Prevention    Complete by:  As directed   Drink plenty of fluids.  Prune juice may be helpful.  You may use a stool softener, such as Colace (over the counter) 100 mg twice a day.  Use MiraLax (over the counter) for constipation as needed.     Diet - low sodium heart healthy    Complete by:  As directed      Increase activity slowly as tolerated    Complete by:  As directed             Medication List    STOP taking these medications        FISH OIL PO     RED YEAST RICE PO      TAKE these medications        aspirin EC 81 MG tablet  Take 1 tablet (81 mg total) by mouth daily. Resume 4 days post-op     BYSTOLIC 20 MG Tabs  Generic drug:  Nebivolol HCl  Take 20 mg by mouth at bedtime.     cholecalciferol 1000 UNITS tablet  Commonly known as:  VITAMIN D  Take 1,000 Units by mouth daily.     docusate sodium 100 MG capsule  Commonly known as:  COLACE  Take 1 capsule (100 mg total) by mouth 2 (two) times daily as needed for mild constipation.     lisinopril-hydrochlorothiazide 10-12.5 MG per tablet  Commonly known as:  PRINZIDE,ZESTORETIC  Take 1 tablet by mouth every morning.     methocarbamol 500 MG tablet  Commonly known as:  ROBAXIN  Take 1 tablet (500 mg total)  by mouth every 8 (eight) hours as needed for muscle spasms.     oxyCODONE-acetaminophen 5-325 MG per tablet  Commonly known as:  PERCOCET  Take 1 tablet by mouth every 4 (four) hours as needed.     VITAMIN C PO  Take 1 capsule by mouth 2 (two) times daily.           Follow-up Information    Follow up with BEANE,JEFFREY C, MD In 2 weeks.   Specialty:  Orthopedic Surgery   Why:  For suture removal   Contact information:   [address] [place] [address] [phone]       Signed: Lacie Draft, PA-C Orthopaedic Surgery 09/08/2014, 10:04 AM

## 2014-09-08 NOTE — Progress Notes (Signed)
Subjective: 1 Day Post-Op Procedure(s) (LRB): MICRO LUMBAR DECOMPRESSION L4-5 (N/A) Seen in AM rounds by myself and Dr. Shelle IronBeane. Patient reports pain as mild.  Reports incisional back pain, well controlled. His leg pain is resolved. He has not had any groin pain. He denies CP, SOB, fever, chills, N/V. Has been OOB ambulating without any issues. Voiding without difficulty.   Objective: Vital signs in last 24 hours: Temp:  [97.3 F (36.3 C)-98.3 F (36.8 C)] 98.2 F (36.8 C) (02/18 0538) Pulse Rate:  [51-65] 58 (02/18 0538) Resp:  [12-18] 18 (02/18 0538) BP: (109-128)/(62-80) 109/67 mmHg (02/18 0538) SpO2:  [99 %-100 %] 100 % (02/18 0538)  Intake/Output from previous day: 02/17 0701 - 02/18 0700 In: 3229.2 [P.O.:1020; I.V.:2209.2] Out: 2975 [Urine:2975] Intake/Output this shift: Total I/O In: -  Out: 630 [Urine:630]  No results for input(s): HGB in the last 72 hours. No results for input(s): WBC, RBC, HCT, PLT in the last 72 hours.  Recent Labs  09/08/14 0425  NA 138  K 3.9  CL 101  CO2 24  BUN 9  CREATININE 1.05  GLUCOSE 128*  CALCIUM 9.4   No results for input(s): LABPT, INR in the last 72 hours.  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 09/08/2014, 10:00 AM

## 2014-09-08 NOTE — Evaluation (Signed)
Physical Therapy Evaluation and discharge Patient Details Name: Anthony James MRN: [id number] DOB: May 17, 1966 Today's Date: 09/08/2014   History of Present Illness  Pt is s/p MICRO LUMBAR DECOMPRESSION L4-5 (N/A)  Clinical Impression  Pt is at MOD I level.  Pt educated on back precautions and home d/c safety to protect surgery site.  Pt was able to negotiate a flight of stairs without difficulty.  Did need verbal cues to remember back precautions from earlier OT session.  Reviewed and he demonstrated bed mobility following precautions.  No acute needs identified and will d/c from PT at this time.     Follow Up Recommendations      Equipment Recommendations       Recommendations for Other Services       Precautions / Restrictions Precautions Precautions: Back Precaution Booklet Issued: Yes (comment) Precaution Comments: Issued back care handout with precautions and reviewed all with pt Restrictions Weight Bearing Restrictions: No      Mobility  Bed Mobility Overal bed mobility: Modified Independent             General bed mobility comments: reviewed log rolling  Transfers Overall transfer level: Independent Equipment used: None                Ambulation/Gait Ambulation/Gait assistance: Independent Ambulation Distance (Feet): 300 Feet Assistive device: None Gait Pattern/deviations: Step-through pattern        Stairs Stairs: Yes Stairs assistance: Modified independent (Device/Increase time) Stair Management: One rail Right;Alternating pattern   General stair comments: no difficulties with stairs  Wheelchair Mobility    Modified Rankin (Stroke Patients Only)       Balance Overall balance assessment: No apparent balance deficits (not formally assessed)                                           Pertinent Vitals/Pain Pain Assessment: 0-10 Pain Score: 2  Pain Location: back Pain Descriptors / Indicators: Sore Pain  Intervention(s): Repositioned    Home Living Family/patient expects to be discharged to:: Private residence Living Arrangements: Alone Available Help at Discharge: Available PRN/intermittently (states he has someone that can stay today and after today, people that can check in on him intermittently) Type of Home: House Home Access: Ramped entrance     Home Layout: Two level Home Equipment: None      Prior Function Level of Independence: Independent               Hand Dominance        Extremity/Trunk Assessment   Upper Extremity Assessment: Defer to OT evaluation           Lower Extremity Assessment: Overall WFL for tasks assessed (denies radiating pain, numbness, or tingling)         Communication   Communication: No difficulties  Cognition Arousal/Alertness: Awake/alert Behavior During Therapy: WFL for tasks assessed/performed Overall Cognitive Status: Within Functional Limits for tasks assessed                      General Comments      Exercises        Assessment/Plan    PT Assessment    PT Diagnosis     PT Problem List    PT Treatment Interventions     PT Goals (Current goals can be found in the Care Plan section) Acute Rehab PT Goals  Patient Stated Goal: home PT Goal Formulation: All assessment and education complete, DC therapy    Frequency     Barriers to discharge        Co-evaluation               End of Session            Functional Assessment Tool Used: objective findings and clinical judgement Functional Limitation: Mobility: Walking and moving around Mobility: Walking and Moving Around Current Status [phone]): 0 percent impaired, limited or restricted Mobility: Walking and Moving Around Goal Status [phone]): 0 percent impaired, limited or restricted Mobility: Walking and Moving Around Discharge Status [phone]): 0 percent impaired, limited or restricted    Time: 8295-6213 PT Time Calculation (min) (ACUTE  ONLY): 16 min   Charges:   PT Evaluation $Initial PT Evaluation Tier I: 1 Procedure     PT G Codes:   PT G-Codes **NOT FOR INPATIENT CLASS** Functional Assessment Tool Used: objective findings and clinical judgement Functional Limitation: Mobility: Walking and moving around Mobility: Walking and Moving Around Current Status ([id number]): 0 percent impaired, limited or restricted Mobility: Walking and Moving Around Goal Status ([id number]): 0 percent impaired, limited or restricted Mobility: Walking and Moving Around Discharge Status ([id number]): 0 percent impaired, limited or restricted    Anthony James 09/08/2014, 10:46 AM

## 2014-09-08 NOTE — Op Note (Signed)
[name]:  Smullen, [name]                 ACCOUNT NO.:  [id number]  MEDICAL RECORD NO.:  [id number]  LOCATION:  1602                         [facility]  PHYSICIAN:  Jene EveryJeffrey Kayra Crowell, M.D.    DATE OF BIRTH:  October 08, 1965  DATE OF PROCEDURE:  09/07/2014 DATE OF DISCHARGE:                              OPERATIVE REPORT   PREOPERATIVE DIAGNOSIS:  Spinal stenosis, herniated nucleus pulposus, L4- 5, right.  POSTOPERATIVE DIAGNOSIS:  Spinal stenosis, herniated nucleus pulposus, L4-5, right.  PROCEDURES PERFORMED: 1. Microlumbar decompression, L4-5, right. 2. Foraminotomy, L4-5, right. 3. Microdiskectomy, L4-5, right.  ANESTHESIA:  General.  ASSISTANT:  Lanna PocheJacqueline Bissell, PA.  HISTORY:  This is a 49 year old, works for [employer], has a work injury with disk herniation, displacement of L5 root, severe canal stenosis, radicular pain, and EHL weakness.  The patient also had some groin pain. Preoperative radiographs demonstrated some irregularity of the femoral head, some signs of AVN.  We did feel, however, this was separate component in terms of the disk herniation, the right lower extremity pain, EHL weakness despite rest, activity, modification, and corticosteroid injection.  Risks and benefits discussed including bleeding, infection, damage to neurovascular structures, DVT, PE, anesthetic complications, etc.  TECHNIQUE:  With the patient in supine position, after induction of adequate general anesthesia, 2 g Kefzol, placed prone on the [place] frame.  All bony prominences were well padded.  Lumbar region was prepped and draped in usual sterile fashion.  Two 18-gauge spinal needles were utilized to localize L4-5 interspace, confirmed with x-ray. Incision was made from spinous process, L4-5.  Subcutaneous tissue was dissected.  Electrocautery was utilized to achieve hemostasis. Dorsolumbar fascia was identified and divided in line with skin incision.  Paraspinous muscle elevated from the  lamina of L4-5. Operating microscope was draped, brought onto the surgical field. Confirmatory radiograph obtained.  Hemilaminotomy at the caudad edge of L5 was performed with straight curette.  Neuro patty placed beneath the ligamentum flavum.  Hemilaminotomy of the caudad edge of L4 was performed with 2 and 3 mm Kerrison.  I detached the ligamentum flavum, preserving the pars.  Ligamentum flavum was removed from the interspace meticulously.  Protecting neural elements at all times.  He had severe lateral recess stenosis.  After performing a generous foraminotomy of L5, identifying the L5 root, mobilizing it medially.  We decompressed the lateral recess to the medial border of the pedicle.  Foraminotomy of L4 was performed as well.  He had disk degeneration.  Focal HNP was noted.  We performed an annulotomy.  Copious portion of the disk material was removed from the disk space with straight and upbiting pituitary at a degenerated disk.  We made multiple passes, irrigating the disk space, catheter irrigation, and then multiple fragments were retrieved.  Further mobilized with a Woodson retractor both medially and laterally.  Following the full diskectomy of herniated material, there was no further herniated material.  We checked beneath thecal sac, the axilla of the L4 and L5 foramen as well as a shoulder of the root. There was no residual disk herniation.  Bone wax was placed on the cancellous surfaces.  Copiously irrigated the wound.  No evidence of CSF  leakage or active bleeding.  Thrombin-soaked Gelfoam was placed in the laminotomy defect.  We removed McCullough retractors.  We irrigated the wound.  No evidence of active bleeding.  Repaired the fascia with 1 Vicryl, subcu with 2-0, and skin with 4-0 subcuticular Prolene.  Sterile dressing was applied.  Placed supine on the hospital bed, extubated without difficulty, and transported to the recovery room in  satisfactory condition.  The patient tolerated the procedure well.  No complications.  Assistant, Lanna Poche, [place], was used throughout the case for patient positioning, gentle intermittent nerve traction, and closure.     Jene Every, M.D.     Cordelia Pen  D:  09/07/2014  T:  09/08/2014  Job:  [id number]

## 2014-09-08 NOTE — Anesthesia Postprocedure Evaluation (Signed)
  Anesthesia Post-op Note  Patient: Anthony James  Procedure(s) Performed: Procedure(s) (LRB): MICRO LUMBAR DECOMPRESSION L4-5 (N/A)  Patient Location: PACU  Anesthesia Type: General  Level of Consciousness: awake and alert   Airway and Oxygen Therapy: Patient Spontanous Breathing  Post-op Pain: mild  Post-op Assessment: Post-op Vital signs reviewed, Patient's Cardiovascular Status Stable, Respiratory Function Stable, Patent Airway and No signs of Nausea or vomiting  Last Vitals:  Filed Vitals:   09/08/14 0538  BP: 109/67  Pulse: 58  Temp: 36.8 C  Resp: 18    Post-op Vital Signs: stable   Complications: No apparent anesthesia complications

## 2014-12-16 ENCOUNTER — Other Ambulatory Visit: Payer: Self-pay

## 2016-09-13 ENCOUNTER — Encounter: Payer: Self-pay | Admitting: Cardiology

## 2016-10-10 ENCOUNTER — Emergency Department (HOSPITAL_BASED_OUTPATIENT_CLINIC_OR_DEPARTMENT_OTHER)
Admit: 2016-10-10 | Discharge: 2016-10-10 | Disposition: A | Discharge status: Home / Self Care | Payer: BLUE CROSS/BLUE SHIELD | Attending: Emergency Medicine | Admitting: Emergency Medicine

## 2016-10-10 ENCOUNTER — Emergency Department (HOSPITAL_COMMUNITY)
Admission: EM | Admit: 2016-10-10 | Discharge: 2016-10-10 | Disposition: A | Discharge status: Home / Self Care | Payer: BLUE CROSS/BLUE SHIELD | Attending: Emergency Medicine | Admitting: Emergency Medicine

## 2016-10-10 ENCOUNTER — Encounter (HOSPITAL_COMMUNITY): Payer: Self-pay | Admitting: Vascular Surgery

## 2016-10-10 ENCOUNTER — Emergency Department (HOSPITAL_COMMUNITY): Payer: BLUE CROSS/BLUE SHIELD

## 2016-10-10 DIAGNOSIS — I10 Essential (primary) hypertension: Secondary | ICD-10-CM | POA: Diagnosis not present

## 2016-10-10 DIAGNOSIS — M7989 Other specified soft tissue disorders: Secondary | ICD-10-CM

## 2016-10-10 DIAGNOSIS — Z79899 Other long term (current) drug therapy: Secondary | ICD-10-CM | POA: Insufficient documentation

## 2016-10-10 DIAGNOSIS — Z7982 Long term (current) use of aspirin: Secondary | ICD-10-CM | POA: Insufficient documentation

## 2016-10-10 HISTORY — DX: Essential (primary) hypertension: I10

## 2016-10-10 NOTE — Progress Notes (Signed)
*  Preliminary Results* Left lower extremity venous duplex completed. Left lower extremity is negative for deep vein thrombosis. There is no evidence of left Baker's cyst.  10/10/2016 8:27 AM  Gertie FeyMichelle Deena Shaub, BS, RVT, RDCS, RDMS

## 2016-10-10 NOTE — ED Notes (Signed)
Patient transported to X-ray 

## 2016-10-10 NOTE — ED Provider Notes (Signed)
[facility]-EMERGENCY DEPT Provider Note   CSN: [id number] Arrival date & time: 10/10/16  [id number]     History   Chief Complaint Chief Complaint  Patient presents with  . Leg Swelling    HPI Anthony James is a 51 y.o. male.  The history is provided by the patient. No language interpreter was used.   Anthony James is a 51 y.o. male who presents to the Emergency Department complaining of leg swelling.  Patient presents for evaluation of left leg swelling for the last 2 days.  He denies any history of injury to his leg. He has pain to the distal left calf and tibia. No prior history of blood clots. He denies any chest pain, shortness breath, fevers, nausea, vomiting, abdominal pain. He has a history of hypertension and history of atrial flutter that is not currently active. He takes no blood thinners. Past Medical History:  Diagnosis Date  . Atrial flutter (HCC)    08/05/2013  . Dysrhythmia   . Hypertension     Patient Active Problem List   Diagnosis Date Noted  . Spinal stenosis of lumbar region 09/07/2014  . Spinal stenosis at L4-L5 level 09/07/2014  . H/O atrial flutter 08/05/2013    Past Surgical History:  Procedure Laterality Date  . LUMBAR LAMINECTOMY/DECOMPRESSION MICRODISCECTOMY N/A 09/07/2014   Procedure: MICRO LUMBAR DECOMPRESSION L4-5;  Surgeon: Javier DockerJeffrey C Beane, MD;  Location: [facility];  Service: Orthopedics;  Laterality: N/A;  . surgery for hole in retinas     hx of        Home Medications    Prior to Admission medications   Medication Sig Start Date End Date Taking? Authorizing Provider  Ascorbic Acid (VITAMIN C PO) Take 1 capsule by mouth 2 (two) times daily.     Historical Provider, MD  aspirin EC 81 MG tablet Take 1 tablet (81 mg total) by mouth daily. Resume 4 days post-op 09/08/14   Dorothy SparkJaclyn M Bissell, PA-C  cholecalciferol (VITAMIN D) 1000 UNITS tablet Take 1,000 Units by mouth daily.    Historical Provider, MD  docusate sodium (COLACE) 100 MG capsule Take 1 capsule  (100 mg total) by mouth 2 (two) times daily as needed for mild constipation. 09/07/14   Jene EveryJeffrey Beane, MD  lisinopril-hydrochlorothiazide (PRINZIDE,ZESTORETIC) 10-12.5 MG per tablet Take 1 tablet by mouth every morning.     Historical Provider, MD  methocarbamol (ROBAXIN) 500 MG tablet Take 1 tablet (500 mg total) by mouth every 8 (eight) hours as needed for muscle spasms. 09/07/14   Jene EveryJeffrey Beane, MD  Nebivolol HCl (BYSTOLIC) 20 MG TABS Take 20 mg by mouth at bedtime.     Historical Provider, MD  oxyCODONE-acetaminophen (PERCOCET) 5-325 MG per tablet Take 1 tablet by mouth every 4 (four) hours as needed. 09/07/14   Jene EveryJeffrey Beane, MD    Family History No family history on file.  Social History Social History  Substance Use Topics  . Smoking status: Never Smoker  . Smokeless tobacco: Never Used  . Alcohol use 1.8 - 2.4 oz/week    3 - 4 Cans of beer per week     Comment: daily     Allergies   Patient has no known allergies.   Review of Systems Review of Systems  All other systems reviewed and are negative.    Physical Exam Updated Vital Signs BP (!) 143/96 (BP Location: Right Arm)   Pulse (!) 54   Temp 98.1 F (36.7 C) (Oral)   Resp 16   SpO2 97%  Physical Exam  Constitutional: He is oriented to person, place, and time. He appears well-developed and well-nourished.  HENT:  Head: Normocephalic and atraumatic.  Cardiovascular: Normal rate and regular rhythm.   No murmur heard. Pulmonary/Chest: Effort normal and breath sounds normal. No respiratory distress.  Abdominal: Soft. There is no tenderness. There is no rebound and no guarding.  Musculoskeletal:  2+ DP pulses bilaterally. Mild swelling to the left lower extremity in the distal calf and tibia. There is mild tenderness to the left distal lateral tibia. No tenderness over the ankle, foot. No rash to the legs.  Neurological: He is alert and oriented to person, place, and time.  5 out of 5 strength in bilateral lower  extremities with sensation to light touch intact in bilateral lower extremities.  Skin: Skin is warm and dry.  Psychiatric: He has a normal mood and affect. His behavior is normal.  Nursing note and vitals reviewed.    ED Treatments / Results  Labs (all labs ordered are listed, but only abnormal results are displayed) Labs Reviewed - No data to display  EKG  EKG Interpretation  Date/Time:  Thursday October 10 2016 07:11:26 EDT Ventricular Rate:  52 PR Interval:  202 QRS Duration: 90 QT Interval:  416 QTC Calculation: 386 R Axis:   77 Text Interpretation:  Sinus bradycardia Possible Left atrial enlargement Left ventricular hypertrophy Nonspecific T wave abnormality Abnormal ECG Confirmed by Lincoln Brigham [phone]) on 10/10/2016 7:16:48 AM       Radiology No results found.  Procedures Procedures (including critical care time)  Medications Ordered in ED Medications - No data to display   Initial Impression / Assessment and Plan / ED Course  I have reviewed the triage vital signs and the nursing notes.  Pertinent labs & imaging results that were available during my care of the patient were reviewed by me and considered in my medical decision making (see chart for details).     Patient here for evaluation of atraumatic left lower extremity pain and swelling. He is neurovascularly intact on examination with no evidence of acute infectious process. He has firm swelling to the distal leg, plain films obtained to eval for bony abnormality and vascular ultrasound obtained to rule out DVT.  Imaging is negative for acute process. Discussed with patient home care for lower extremity edema of unclear etiology. Recommend ibuprofen, warm compresses.  Counseled pt on patient on PCP follow-up, return precautions. Final Clinical Impressions(s) / ED Diagnoses   Final diagnoses:  Left leg swelling    New Prescriptions New Prescriptions   No medications on file     Tilden Fossa,  MD 10/10/16 [phone]

## 2016-10-10 NOTE — ED Triage Notes (Signed)
Pt reports to the ED for eval of LLE swelling x 2 days. Pt denies any hx of injury to the leg, recent travel, being bed-ridden, or recent surgeries. He states that he has a hx of A. Flutter but he was not on blood thinners and was not in persistent A. Flutter and he was cleared 2 weeks ago by cardiologist. Denies any CP, SOB, or hx of CHF.

## 2020-06-02 NOTE — Progress Notes (Signed)
Patient referred by [name], MD for atrial flutter  Subjective:   Anthony James, male    DOB: [birth year], 54 y.o.   MRN: [id number]   Chief Complaint  Patient presents with  . New Patient (Initial Visit)    HPI  54 y.o. African American male with hypertension, typical atrial flutter  Patient had typical atrial flutter in 2015, that spontaneously converted to sinus rhythm while in the ED. There was a mention of ASD?PFO in the past, but echocardiogram in 2016 did not show either.  Patient is now referred back to me due to atrial flutter. 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 He denies chest pain, shortness of breath, palpitations, leg edema, orthopnea, PND, TIA/syncope.Blood pressure is elevated today, but is usually well controlled. He does endorse occasional snoring.   Past Medical History:  Diagnosis Date  . Atrial flutter ([place])    08/05/2013  . Dysrhythmia   . Hypertension      Past Surgical History:  Procedure Laterality Date  . LUMBAR LAMINECTOMY/DECOMPRESSION MICRODISCECTOMY N/A 09/07/2014   Procedure: MICRO LUMBAR DECOMPRESSION L4-5;  Surgeon: Johnn Hai, MD;  Location: [facility];  Service: Orthopedics;  Laterality: N/A;  . surgery for hole in retinas     hx of      Social History   Tobacco Use  Smoking Status Never Smoker  Smokeless Tobacco Never Used    Social History   Substance and Sexual Activity  Alcohol Use Yes  . Alcohol/week: 3.0 - 4.0 standard drinks  . Types: 3 - 4 Cans of beer per week   Comment: daily     History reviewed. No pertinent family history.   Current Outpatient Medications on File Prior to Visit  Medication Sig Dispense Refill  . Ascorbic Acid (VITAMIN C PO) Take 1 capsule by mouth 2 (two) times daily.     Marland Kitchen aspirin EC 81 MG tablet Take 1 tablet (81 mg total) by mouth daily. Resume 4 days post-op    . atorvastatin (LIPITOR) 20 MG tablet Take 1 tablet by mouth daily.    . cholecalciferol (VITAMIN D)  1000 UNITS tablet Take 1,000 Units by mouth daily.    Marland Kitchen lisinopril-hydrochlorothiazide (PRINZIDE,ZESTORETIC) 10-12.5 MG per tablet Take 1 tablet by mouth every morning.     . Nebivolol HCl (BYSTOLIC) 20 MG TABS Take 20 mg by mouth at bedtime.     . Omega-3 Fatty Acids (OMEGA-3 EPA FISH OIL PO) Take 900 mg by mouth.     No current facility-administered medications on file prior to visit.    Cardiovascular and other pertinent studies:  EKG 06/05/2020: Typical atrial flutter 4:1 conduction   EKG 05/15/2020:  Sinus rhythm 57 bpm Anterolateral T wave changes, consider ischemia  LE vascular US 09/2016: - No evidence of deep vein thrombosis involving the left lower  extremity and right common femoral vein.  - No evidence of Baker&'s cyst on the left.   Echocardiogram 2016: LVEF 51%.  No significant valvular abnormalities. Normal echocardiogram   Recent labs: 05/15/2020: Glucose 103, BUN/Cr 12/0.94. EGFR 1107. Na/K 140/4.6. Rest of the CMP normal H/H 13.5/41.3. MCV 101.7. Platelets 290 Chol 158, TG 141, HDL 70, LDL 60 TSH 1.22 normal   Review of Systems  Cardiovascular: Negative for chest pain, dyspnea on exertion, leg swelling, palpitations and syncope.         Vitals:   06/05/20 1507 06/05/20 1514  BP: (!) 163/100 (!) 163/106  Pulse: 62 64  Resp:  16 16  SpO2: 100% 100%     Body mass index is 24.54 kg/m. Filed Weights   06/05/20 1507  Weight: 171 lb (77.6 kg)     Objective:   Physical Exam Vitals and nursing note reviewed.  Constitutional:      General: He is not in acute distress. Neck:     Vascular: No JVD.  Cardiovascular:     Rate and Rhythm: Normal rate and regular rhythm.     Heart sounds: Normal heart sounds. No murmur heard.   Pulmonary:     Effort: Pulmonary effort is normal.     Breath sounds: Normal breath sounds. No wheezing or rales.         Assessment & Recommendations:   54 y.o. African American male with hypertension, typical  atrial flutter  Typical atrial flutter (HCC) Rate controlled. Currently asymptomatic. I discussed options of rate control vs rhythm control-including cardioversion and/or ablation. Given that he is asymptomatic, he would like to continue rate control for now with bystolic [id number] score 1, annual stroke risk 0.6%. Okay to continue Aspirin 81 mg for now. Unless considering rhythm control management, does not need anticoagulation.  Recommend echocardiogram and sleep study.   Essential hypertension Usually well controlled. No change made today.   Further recommendations after above testing    Thank you for referring the patient to [place]. Please feel free to contact with any questions.   Nigel Mormon, MD Pager: [phone] Office: [phone]

## 2020-06-05 ENCOUNTER — Ambulatory Visit: Payer: BLUE CROSS/BLUE SHIELD | Admitting: Cardiology

## 2020-06-05 ENCOUNTER — Encounter: Payer: Self-pay | Admitting: Cardiology

## 2020-06-05 ENCOUNTER — Other Ambulatory Visit: Payer: Self-pay

## 2020-06-05 VITALS — BP 163/106 | HR 64 | Resp 16 | Ht 70.0 in | Wt 171.0 lb

## 2020-06-05 DIAGNOSIS — I483 Typical atrial flutter: Secondary | ICD-10-CM

## 2020-06-05 DIAGNOSIS — I10 Essential (primary) hypertension: Secondary | ICD-10-CM | POA: Insufficient documentation

## 2020-08-07 ENCOUNTER — Other Ambulatory Visit: Payer: BC Managed Care – PPO

## 2020-08-16 ENCOUNTER — Ambulatory Visit: Payer: BC Managed Care – PPO | Admitting: Cardiology

## 2020-08-28 ENCOUNTER — Ambulatory Visit: Payer: BC Managed Care – PPO

## 2020-08-28 ENCOUNTER — Other Ambulatory Visit: Payer: Self-pay

## 2020-08-28 DIAGNOSIS — I483 Typical atrial flutter: Secondary | ICD-10-CM

## 2020-09-04 NOTE — Progress Notes (Signed)
Patient didn't answer left vm will try again later

## 2020-09-04 NOTE — Progress Notes (Signed)
Called pt no answer, left a vm

## 2020-09-05 NOTE — Progress Notes (Signed)
Patient called back, I have discussed results with him.

## 2020-09-05 NOTE — Progress Notes (Signed)
Third attempt to call pt, no answer. Left vm requesting call back.

## 2020-10-02 ENCOUNTER — Ambulatory Visit: Payer: BC Managed Care – PPO | Admitting: Cardiology

## 2020-10-08 NOTE — Progress Notes (Signed)
Patient referred by [name], MD for atrial flutter  Subjective:   Anthony James, male    DOB: [birth year], 55 y.o.   MRN: [id number]   Chief Complaint  Patient presents with  . Atrial Flutter  . Follow-up    3 month  . Results    echo    HPI  55 y.o. African American male with hypertension, typical atrial flutter  Patient is doing well, denies chest pain, shortness of breath, palpitations, leg edema, orthopnea, PND, TIA/syncope.  Initial consultation HPI: Patient had typical atrial flutter in 2015, that spontaneously converted to sinus rhythm while in the ED. There was a mention of ASD?PFO in the past, but echocardiogram in 2016 did not show either.  Patient is now referred back to me due to atrial flutter. 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 He denies chest pain, shortness of breath, palpitations, leg edema, orthopnea, PND, TIA/syncope.Blood pressure is elevated today, but is usually well controlled. He does endorse occasional snoring.   Past Medical History:  Diagnosis Date  . Atrial flutter ([place])    08/05/2013  . Dysrhythmia   . Hypertension      Past Surgical History:  Procedure Laterality Date  . LUMBAR LAMINECTOMY/DECOMPRESSION MICRODISCECTOMY N/A 09/07/2014   Procedure: MICRO LUMBAR DECOMPRESSION L4-5;  Surgeon: Johnn Hai, MD;  Location: [facility];  Service: Orthopedics;  Laterality: N/A;  . surgery for hole in retinas     hx of      Social History   Tobacco Use  Smoking Status Never Smoker  Smokeless Tobacco Never Used    Social History   Substance and Sexual Activity  Alcohol Use Yes  . Alcohol/week: 3.0 - 4.0 standard drinks  . Types: 3 - 4 Cans of beer per week   Comment: occ     No family history on file.   Current Outpatient Medications on File Prior to Visit  Medication Sig Dispense Refill  . Ascorbic Acid (VITAMIN C PO) Take 1 capsule by mouth 2 (two) times daily.     Marland Kitchen aspirin EC 81 MG tablet Take 1 tablet  (81 mg total) by mouth daily. Resume 4 days post-op    . atorvastatin (LIPITOR) 20 MG tablet Take 1 tablet by mouth daily.    . cholecalciferol (VITAMIN D) 1000 UNITS tablet Take 1,000 Units by mouth daily.    Marland Kitchen lisinopril-hydrochlorothiazide (PRINZIDE,ZESTORETIC) 10-12.5 MG per tablet Take 1 tablet by mouth every morning.     . Nebivolol HCl (BYSTOLIC) 20 MG TABS Take 20 mg by mouth at bedtime.     . Omega-3 Fatty Acids (OMEGA-3 EPA FISH OIL PO) Take 900 mg by mouth.     No current facility-administered medications on file prior to visit.    Cardiovascular and other pertinent studies:  Echocardiogram 08/28/2020:  Left ventricle cavity is normal in size and wall thickness. Normal global  wall motion. Normal LV systolic function with EF 58%. Doppler evidence of  grade I (impaired) diastolic dysfunction, normal LAP.  Left atrial cavity is mildly dilated. Aneurysmal interatrial septum  without 2D or color Doppler evidence of shunting.  Mild tricuspid regurgitation.  No evidence of pulmonary hypertension.  Compared to previous study in 4665, grade 1 diastolic dysfunction is new.  No definite evidence of interatrial shunting seen.  EKG 06/05/2020: Typical atrial flutter 4:1 conduction   EKG 05/15/2020:  Sinus rhythm 57 bpm Anterolateral T wave changes, consider ischemia  LE vascular US 09/2016: - No evidence  of deep vein thrombosis involving the left lower  extremity and right common femoral vein.  - No evidence of Baker&'s cyst on the left.   Echocardiogram 2016: LVEF 51%.  No significant valvular abnormalities. Normal echocardiogram   Recent labs: 05/15/2020: Glucose 103, BUN/Cr 12/0.94. EGFR 1107. Na/K 140/4.6. Rest of the CMP normal H/H 13.5/41.3. MCV 101.7. Platelets 290 Chol 158, TG 141, HDL 70, LDL 60 TSH 1.22 normal   Review of Systems  Cardiovascular: Negative for chest pain, dyspnea on exertion, leg swelling, palpitations and syncope.        Vitals:    10/09/20 0936 10/09/20 0939  BP: 139/90 140/88  Pulse: 65 63  Resp: 16   Temp: (!) 96.5 F (35.8 C)   SpO2: 100%      Body mass index is 24.16 kg/m. Filed Weights   10/09/20 0936  Weight: 168 lb 6.4 oz (76.4 kg)     Objective:   Physical Exam Vitals and nursing note reviewed.  Constitutional:      General: He is not in acute distress. Neck:     Vascular: No JVD.  Cardiovascular:     Rate and Rhythm: Normal rate and regular rhythm.     Heart sounds: Normal heart sounds. No murmur heard.   Pulmonary:     Effort: Pulmonary effort is normal.     Breath sounds: Normal breath sounds. No wheezing or rales.         Assessment & Recommendations:   54 y.o. African American male with hypertension, typical atrial flutter  Typical atrial flutter (HCC) Rate controlled. Currently asymptomatic. I again discussed options of rate control vs rhythm control-including cardioversion and/or ablation. Given that he is asymptomatic, he would like to continue rate control for now with bystolic CHA2DS2VASc score 1, annual stroke risk 0.6%. Okay to continue Aspirin 81 mg for now. Unless considering rhythm control management, does not need anticoagulation.   Essential hypertension Usually well controlled. No change made today.   F/u in 1 year    Manish J Patwardhan, MD Pager: [phone] Office: [phone] 

## 2020-10-09 ENCOUNTER — Encounter: Payer: Self-pay | Admitting: Cardiology

## 2020-10-09 ENCOUNTER — Ambulatory Visit: Payer: BC Managed Care – PPO | Admitting: Cardiology

## 2020-10-09 ENCOUNTER — Other Ambulatory Visit: Payer: Self-pay

## 2020-10-09 VITALS — BP 140/88 | HR 63 | Temp 96.5°F | Resp 16 | Ht 70.0 in | Wt 168.4 lb

## 2020-10-09 DIAGNOSIS — I10 Essential (primary) hypertension: Secondary | ICD-10-CM

## 2020-10-09 DIAGNOSIS — I483 Typical atrial flutter: Secondary | ICD-10-CM

## 2021-01-29 ENCOUNTER — Ambulatory Visit
Admission: RE | Admit: 2021-01-29 | Discharge: 2021-01-29 | Disposition: A | Discharge status: Home / Self Care | Payer: BLUE CROSS/BLUE SHIELD | Source: Ambulatory Visit | Attending: Internal Medicine | Admitting: Internal Medicine

## 2021-01-29 ENCOUNTER — Other Ambulatory Visit: Payer: Self-pay | Admitting: Internal Medicine

## 2021-01-29 DIAGNOSIS — M25551 Pain in right hip: Secondary | ICD-10-CM

## 2021-02-09 ENCOUNTER — Encounter: Payer: Self-pay | Admitting: Orthopaedic Surgery

## 2021-02-09 ENCOUNTER — Other Ambulatory Visit: Payer: Self-pay

## 2021-02-09 ENCOUNTER — Ambulatory Visit (INDEPENDENT_AMBULATORY_CARE_PROVIDER_SITE_OTHER): Payer: BC Managed Care – PPO | Admitting: Orthopaedic Surgery

## 2021-02-09 VITALS — Ht 70.0 in | Wt 163.0 lb

## 2021-02-09 DIAGNOSIS — M87051 Idiopathic aseptic necrosis of right femur: Secondary | ICD-10-CM | POA: Diagnosis not present

## 2021-02-09 NOTE — Progress Notes (Signed)
Office Visit Note   Patient: Anthony James           Date of Birth: Jul 12, 1966           MRN: [id number] Visit Date: 02/09/2021              Requested by: Ralene Ok, MD [address] [place],  [place] [address] PCP: Ralene Ok, MD   Assessment & Plan: Visit Diagnoses:  1. Avascular necrosis of bone of right hip (HCC)     Plan: Impression is symptomatic right femoral head avascular necrosis.  He seems to be improving significantly from this.  Overall the x-rays did not show any significant collapse of the femoral head but does look like there is a fair amount of femoral head that is involved.  Fortunately the pain is relatively mild.  Treatment options were discussed as well as further imaging with MRI.  He will think about his options and let us know if he wants [place] to obtain MRI to better evaluate his condition.  Follow-Up Instructions: Return if symptoms worsen or fail to improve.   Orders:  No orders of the defined types were placed in this encounter.  No orders of the defined types were placed in this encounter.     Procedures: No procedures performed   Clinical Data: No additional findings.   Subjective: Chief Complaint  Patient presents with   Right Hip - Pain    Mr. Agcaoili is a very pleasant 55 year old gentleman here for evaluation of right hip pain for about 2 weeks that was originally very severe.  Denies any injuries.  Denies any risk factors for avascular necrosis.  He is a referral from Dr. Ludwig Clarks his PCP.  He states that he has noticed improvement since Monday and he is walking and moving his have much better.  He works as a [employer] manager.  He had a microdiscectomy of the lumbar spine 2015.  He denies any numbness and tingling or any signs or symptoms reminiscent of radiculopathy.  Currently he rates the pain as 1 out of 10.  He feels pain deep in the right hip region.   Review of Systems  Constitutional: Negative.   All other systems reviewed and are  negative.   Objective: Vital Signs: Ht 5\' 10"  (1.778 m)   Wt 163 lb (73.9 kg)   BMI 23.39 kg/m   Physical Exam Vitals and nursing note reviewed.  Constitutional:      Appearance: He is well-developed.  HENT:     Head: Normocephalic and atraumatic.  Eyes:     Pupils: Pupils are equal, round, and reactive to light.  Pulmonary:     Effort: Pulmonary effort is normal.  Abdominal:     Palpations: Abdomen is soft.  Musculoskeletal:        General: Normal range of motion.     Cervical back: Neck supple.  Skin:    General: Skin is warm.  Neurological:     Mental Status: He is alert and oriented to person, place, and time.  Psychiatric:        Behavior: Behavior normal.        Thought Content: Thought content normal.        Judgment: Judgment normal.    Ortho Exam Right hip pain is and see shape pattern.  Pain with FADIR and external rotation.  No sciatic tension signs.  He has pain with resisted straight leg.  Lateral hip is nontender.  Lumbar spine is nontender.  Specialty Comments:  No specialty comments available.  Imaging: No results found.   PMFS History: Patient Active Problem List   Diagnosis Date Noted   Essential hypertension 06/05/2020   Typical atrial flutter (HCC) 06/05/2020   Spinal stenosis of lumbar region 09/07/2014   Spinal stenosis at L4-L5 level 09/07/2014   H/O atrial flutter 08/05/2013   Past Medical History:  Diagnosis Date   Atrial flutter (HCC)    08/05/2013   Dysrhythmia    Hypertension     No family history on file.  Past Surgical History:  Procedure Laterality Date   LUMBAR LAMINECTOMY/DECOMPRESSION MICRODISCECTOMY N/A 09/07/2014   Procedure: MICRO LUMBAR DECOMPRESSION L4-5;  Surgeon: Javier Docker, MD;  Location: [facility];  Service: Orthopedics;  Laterality: N/A;   surgery for hole in retinas     hx of    Social History   Occupational History   Not on file  Tobacco Use   Smoking status: Never   Smokeless tobacco: Never   Vaping Use   Vaping Use: Never used  Substance and Sexual Activity   Alcohol use: Yes    Alcohol/week: 3.0 - 4.0 standard drinks    Types: 3 - 4 Cans of beer per week    Comment: occ   Drug use: No   Sexual activity: Not on file

## 2021-03-29 ENCOUNTER — Telehealth: Payer: Self-pay | Admitting: Orthopaedic Surgery

## 2021-03-29 NOTE — Telephone Encounter (Signed)
Yes

## 2021-03-29 NOTE — Telephone Encounter (Signed)
Pt called stating he decided to have an MRI on his right hip. Pt asking Dr. Roda Shutters to send referral for MRI. Please call pt at [phone] or [phone]

## 2021-03-30 ENCOUNTER — Other Ambulatory Visit: Payer: Self-pay

## 2021-03-30 DIAGNOSIS — M25551 Pain in right hip: Secondary | ICD-10-CM

## 2021-03-30 NOTE — Telephone Encounter (Signed)
Order made. Patient aware.  

## 2021-04-17 ENCOUNTER — Other Ambulatory Visit: Payer: Self-pay

## 2021-04-17 ENCOUNTER — Ambulatory Visit
Admission: RE | Admit: 2021-04-17 | Discharge: 2021-04-17 | Disposition: A | Discharge status: Home / Self Care | Payer: BC Managed Care – PPO | Source: Ambulatory Visit | Attending: Orthopaedic Surgery | Admitting: Orthopaedic Surgery

## 2021-04-17 DIAGNOSIS — M25551 Pain in right hip: Secondary | ICD-10-CM

## 2021-04-19 ENCOUNTER — Ambulatory Visit (INDEPENDENT_AMBULATORY_CARE_PROVIDER_SITE_OTHER): Payer: BC Managed Care – PPO | Admitting: Orthopaedic Surgery

## 2021-04-19 ENCOUNTER — Other Ambulatory Visit: Payer: Self-pay

## 2021-04-19 DIAGNOSIS — M87051 Idiopathic aseptic necrosis of right femur: Secondary | ICD-10-CM | POA: Diagnosis not present

## 2021-04-19 NOTE — Progress Notes (Signed)
   Office Visit Note   Patient: Anthony James           Date of Birth: Mar 30, 1966           MRN: [id number] Visit Date: 04/19/2021              Requested by: Ralene Ok, MD [address] [place],  [place] [address] PCP: Ralene Ok, MD   Assessment & Plan: Visit Diagnoses:  1. Avascular necrosis of bone of right hip (HCC)     Plan: MRI does show large sclerotic lesion within the right femoral head that encompasses about 30% of the volume.  Fortunately his symptoms are relatively mild with occasional breakthrough pain.  At this time I do not recommend hip replacement given lack of severe symptoms.  I have recommended that he see his primary care doctor to discuss the possibility of taking bisphosphonates for the AVN.  He also has a small area in the left femoral head consistent with avascular necrosis.  He will follow-up with me as needed.  Follow-Up Instructions: Return if symptoms worsen or fail to improve.   Orders:  No orders of the defined types were placed in this encounter.  No orders of the defined types were placed in this encounter.     Procedures: No procedures performed   Clinical Data: No additional findings.   Subjective: Chief Complaint  Patient presents with   Right Hip - Pain    Anthony James returns today for MRI review of the right hip to evaluate for severity of avascular necrosis.  Currently not having any pain.   Review of Systems   Objective: Vital Signs: There were no vitals taken for this visit.  Physical Exam  Ortho Exam  Right hip exam is benign.  Specialty Comments:  No specialty comments available.  Imaging: No results found.   PMFS History: Patient Active Problem List   Diagnosis Date Noted   Essential hypertension 06/05/2020   Typical atrial flutter (HCC) 06/05/2020   Spinal stenosis of lumbar region 09/07/2014   Spinal stenosis at L4-L5 level 09/07/2014   H/O atrial flutter 08/05/2013   Past Medical History:  Diagnosis  Date   Atrial flutter (HCC)    08/05/2013   Dysrhythmia    Hypertension     No family history on file.  Past Surgical History:  Procedure Laterality Date   LUMBAR LAMINECTOMY/DECOMPRESSION MICRODISCECTOMY N/A 09/07/2014   Procedure: MICRO LUMBAR DECOMPRESSION L4-5;  Surgeon: Javier Docker, MD;  Location: [facility];  Service: Orthopedics;  Laterality: N/A;   surgery for hole in retinas     hx of    Social History   Occupational History   Not on file  Tobacco Use   Smoking status: Never   Smokeless tobacco: Never  Vaping Use   Vaping Use: Never used  Substance and Sexual Activity   Alcohol use: Yes    Alcohol/week: 3.0 - 4.0 standard drinks    Types: 3 - 4 Cans of beer per week    Comment: occ   Drug use: No   Sexual activity: Not on file

## 2021-09-06 ENCOUNTER — Other Ambulatory Visit: Payer: Self-pay

## 2021-09-06 ENCOUNTER — Ambulatory Visit: Payer: BC Managed Care – PPO | Admitting: Orthopaedic Surgery

## 2021-09-06 ENCOUNTER — Ambulatory Visit: Payer: Self-pay

## 2021-09-06 ENCOUNTER — Encounter: Payer: Self-pay | Admitting: Orthopaedic Surgery

## 2021-09-06 DIAGNOSIS — M87051 Idiopathic aseptic necrosis of right femur: Secondary | ICD-10-CM

## 2021-09-06 NOTE — Progress Notes (Signed)
Office Visit Note   Patient: Anthony James           Date of Birth: 05-04-1966           MRN: [id number] Visit Date: 09/06/2021              Requested by: Ralene Ok, MD [address] [place],  [place] [address] PCP: Ralene Ok, MD   Assessment & Plan: Visit Diagnoses:  1. Avascular necrosis of bone of right hip [facility])     Plan: Mr. Snowball returns today for worsening right hip pain from avascular necrosis.  He was evaluated last year for this.  He has tried to deal with the pain but it is getting worse especially when he is working at [employer] which requires a lot of heavy laboring.  His pain is 5-6 out of 10 at baseline.  Examination of the right hip shows pain with range of motion and flexion.  He has a mild limp when weightbearing and ambulating.  Impression is right hip avascular necrosis.  His symptoms are getting worse despite conservative management.  He is unable to perform his regular job and it sounds like there is no light duty that he can do at [employer] therefore we will write him out of work from now until up to 12 weeks after surgery.  Based on his options he is elected to move forward with a right total hip replacement in the near future.  Risk benefits rehab recovery prognosis all are explained with the patient in detail.  Questions encouraged and answered.  Follow-Up Instructions: No follow-ups on file.   Orders:  Orders Placed This Encounter  Procedures   XR HIP UNILAT W OR W/O PELVIS 2-3 VIEWS RIGHT   No orders of the defined types were placed in this encounter.     Procedures: No procedures performed   Clinical Data: No additional findings.   Subjective: Chief Complaint  Patient presents with   Right Hip - Pain    HPI  Review of Systems   Objective: Vital Signs: There were no vitals taken for this visit.  Physical Exam  Ortho Exam  Specialty Comments:  No specialty comments available.  Imaging: XR HIP UNILAT W OR W/O PELVIS 2-3 VIEWS  RIGHT  Result Date: 09/06/2021 Stable right femoral head avascular necrosis without collapse.  No significant progression of avascular necrosis.    PMFS History: Patient Active Problem List   Diagnosis Date Noted   Avascular necrosis of bone of right hip (HCC) 09/06/2021   Essential hypertension 06/05/2020   Typical atrial flutter (HCC) 06/05/2020   Spinal stenosis of lumbar region 09/07/2014   Spinal stenosis at L4-L5 level 09/07/2014   H/O atrial flutter 08/05/2013   Past Medical History:  Diagnosis Date   Atrial flutter (HCC)    08/05/2013   Dysrhythmia    Hypertension     History reviewed. No pertinent family history.  Past Surgical History:  Procedure Laterality Date   LUMBAR LAMINECTOMY/DECOMPRESSION MICRODISCECTOMY N/A 09/07/2014   Procedure: MICRO LUMBAR DECOMPRESSION L4-5;  Surgeon: Javier Docker, MD;  Location: [facility];  Service: Orthopedics;  Laterality: N/A;   surgery for hole in retinas     hx of    Social History   Occupational History   Not on file  Tobacco Use   Smoking status: Never   Smokeless tobacco: Never  Vaping Use   Vaping Use: Never used  Substance and Sexual Activity   Alcohol use: Yes  Alcohol/week: 3.0 - 4.0 standard drinks    Types: 3 - 4 Cans of beer per week    Comment: occ   Drug use: No   Sexual activity: Not on file

## 2021-09-10 ENCOUNTER — Telehealth: Payer: Self-pay | Admitting: Orthopaedic Surgery

## 2021-09-10 NOTE — Telephone Encounter (Signed)
Pt submitted medical release form, short term disability form, and $25.00 payment to [employer]. Accepted 09/10/21

## 2021-10-10 ENCOUNTER — Other Ambulatory Visit: Payer: Self-pay

## 2021-10-10 ENCOUNTER — Ambulatory Visit: Payer: BC Managed Care – PPO | Admitting: Cardiology

## 2021-10-10 ENCOUNTER — Encounter: Payer: Self-pay | Admitting: Cardiology

## 2021-10-10 VITALS — BP 128/79 | HR 75 | Temp 97.9°F | Resp 16 | Ht 70.0 in | Wt 174.0 lb

## 2021-10-10 DIAGNOSIS — I10 Essential (primary) hypertension: Secondary | ICD-10-CM

## 2021-10-10 DIAGNOSIS — I480 Paroxysmal atrial fibrillation: Secondary | ICD-10-CM | POA: Insufficient documentation

## 2021-10-10 DIAGNOSIS — I483 Typical atrial flutter: Secondary | ICD-10-CM

## 2021-10-10 NOTE — Progress Notes (Addendum)
? ? ?Patient referred by [name], MD for atrial flutter ? ?Subjective:  ? ?Anthony James, male    DOB: 01/11/1966, 56 y.o.   MRN: [id number] ? ? ?Chief Complaint  ?Patient presents with  ? Atrial Flutter  ? Follow-up  ?  1 year  ? ? ?HPI ? ?56 y.o. African American male with hypertension, A-fib/flutter ? ?Patient is doing well, denies chest pain, shortness of breath, palpitations, leg edema, orthopnea, PND, TIA/syncope.  However, EKG today shows A-fib, see below for details.  Patient is scheduled to undergo hip replacement surgery in May 2023. ? ?Initial consultation HPI: ?Patient had typical atrial flutter in 2015, that spontaneously converted to sinus rhythm while in the ED. There was a mention of ASD?PFO in the past, but echocardiogram in 2016 did not show either. ? ?Patient is now referred back to me due to atrial flutter. 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 He denies chest pain, shortness of breath, palpitations, leg edema, orthopnea, PND, TIA/syncope.Blood pressure is elevated today, but is usually well controlled. He does endorse occasional snoring.  ? ? ?Current Outpatient Medications:  ?  Ascorbic Acid (VITAMIN C PO), Take 1 capsule by mouth 2 (two) times daily. , Disp: , Rfl:  ?  aspirin EC 81 MG tablet, Take 1 tablet (81 mg total) by mouth daily. Resume 4 days post-op, Disp: , Rfl:  ?  atorvastatin (LIPITOR) 20 MG tablet, Take 1 tablet by mouth daily., Disp: , Rfl:  ?  cholecalciferol (VITAMIN D) 1000 UNITS tablet, Take 1,000 Units by mouth daily., Disp: , Rfl:  ?  lisinopril-hydrochlorothiazide (PRINZIDE,ZESTORETIC) 10-12.5 MG per tablet, Take 1 tablet by mouth every morning. , Disp: , Rfl:  ?  Nebivolol HCl 20 MG TABS, Take 20 mg by mouth at bedtime. , Disp: , Rfl:  ?  Omega-3 Fatty Acids (OMEGA-3 EPA FISH OIL PO), Take 900 mg by mouth., Disp: , Rfl:  ? ? ? ?Cardiovascular and other pertinent studies: ? ?EKG 10/10/2021: ?Coarse atrial fibrillation 66 bpm ? ?Echocardiogram 08/28/2020:   ?Left ventricle cavity is normal in size and wall thickness. Normal global  ?wall motion. Normal LV systolic function with EF 58%. Doppler evidence of  ?grade I (impaired) diastolic dysfunction, normal LAP.  ?Left atrial cavity is mildly dilated. Aneurysmal interatrial septum  ?without 2D or color Doppler evidence of shunting.  ?Mild tricuspid regurgitation.  ?No evidence of pulmonary hypertension.  ?Compared to previous study in 9937, grade 1 diastolic dysfunction is new.  ?No definite evidence of interatrial shunting seen. ? ?EKG 06/05/2020: ?Typical atrial flutter 4:1 conduction  ? ?EKG 05/15/2020:  ?Sinus rhythm 57 bpm ?Anterolateral T wave changes, consider ischemia ? ?LE vascular US 09/2016: ?- No evidence of deep vein thrombosis involving the left lower  ?  extremity and right common femoral vein.  ?- No evidence of Baker&'s cyst on the left.  ? ?Echocardiogram 2016: ?LVEF 51%.  No significant valvular abnormalities. ?Normal echocardiogram ? ? ?Recent labs: ?05/15/2020: ?Glucose 103, BUN/Cr 12/0.94. EGFR 1107. Na/K 140/4.6. Rest of the CMP normal ?H/H 13.5/41.3. MCV 101.7. Platelets 290 ?Chol 158, TG 141, HDL 70, LDL 60 ?TSH 1.22 normal ? ? ?Review of Systems  ?Cardiovascular:  Negative for chest pain, dyspnea on exertion, leg swelling, palpitations and syncope.  ? ?   ? ?Vitals:  ? 10/10/21 0842  ?BP: 128/79  ?Pulse: 75  ?Resp: 16  ?Temp: 97.9 ?F (36.6 ?C)  ?SpO2: 99%  ? ? ? ?Body mass index is  24.97 kg/m?. Danley Danker Weights  ? 10/10/21 0842  ?Weight: 174 lb (78.9 kg)  ? ? ? ?Objective:  ? Physical Exam ?Vitals and nursing note reviewed.  ?Constitutional:   ?   General: He is not in acute distress. ?Neck:  ?   Vascular: No JVD.  ?Cardiovascular:  ?   Rate and Rhythm: Normal rate and regular rhythm.  ?   Heart sounds: Normal heart sounds. No murmur heard. ?Pulmonary:  ?   Effort: Pulmonary effort is normal.  ?   Breath sounds: Normal breath sounds. No wheezing or rales.  ? ? ?  ICD-10-CM   ?1. Paroxysmal atrial  fibrillation (HCC)  I48.0 PCV ECHOCARDIOGRAM COMPLETE  ?  ?2. Typical atrial flutter (HCC)  I48.3 EKG 12-Lead  ?  ?3. Essential hypertension  I10   ?  ? ?Orders Placed This Encounter  ?Procedures  ? EKG 12-Lead  ? PCV ECHOCARDIOGRAM COMPLETE  ? ? ? ?   ? ?Assessment & Recommendations:  ? ? ?56 y.o. African American male with hypertension, A-fib/flutter ? ?A-fib/flutter: ?Remote history of atrial flutter in 2015.  Patient in A-fib today, unclear if paroxysmal or persistent.  He is completely asymptomatic and well rate controlled. ?We will obtain echocardiogram.  No rhythm control therapy necessary at this time. ?CHA2DS2VASc score 1, annual stroke risk 0.6%. Okay to continue Aspirin 81 mg for now. ?Okay to proceed with hip surgery in May 2023, unless significant abnormalities found on echocardiogram.  ? ?Essential hypertension ?Controlled ? ?We will obtain recent labs from PCP. ? ?F/u in 6 months ? ? ? ?Nigel Mormon, MD ?Pager: [phone] ?Office: [phone] ? ?Addendum: ?07/24/2021: ?Glucose 90, BUN/Cr 14/0.89. EGFR 102. Na/K 138/4.9. Rest of the CMP normal ?H/H 13/39. MCV 33. Platelets 309 ?Chol 139, TG 194, HDL 58, LDL 54 ? ? ?Nigel Mormon, MD ?Pager: [phone] ?Office: [phone] ? ?

## 2021-10-17 ENCOUNTER — Ambulatory Visit: Payer: BC Managed Care – PPO

## 2021-10-17 ENCOUNTER — Other Ambulatory Visit: Payer: Self-pay

## 2021-10-17 DIAGNOSIS — I480 Paroxysmal atrial fibrillation: Secondary | ICD-10-CM

## 2021-10-24 ENCOUNTER — Other Ambulatory Visit: Payer: Self-pay

## 2021-10-29 NOTE — Progress Notes (Signed)
Called pt no answer could not leave a vm

## 2021-10-30 NOTE — Progress Notes (Signed)
Called pt no answer could not leave a vm its busy

## 2021-10-30 NOTE — Progress Notes (Signed)
Pt called back, he voiced understanding.

## 2021-11-06 NOTE — Pre-Procedure Instructions (Signed)
Surgical Instructions ? ? ? Your procedure is scheduled on Monday, May 1st.. ? Report to [facility] Main Entrance "A" at 5:30 A.M., then check in with the Admitting office. ? Call this number if you have problems the morning of surgery: ? [phone] ? ? If you have any questions prior to your surgery date call [phone]: Open Monday-Friday 8am-4pm ? ? ? Remember: ? Do not eat after midnight the night before your surgery ? ?You may drink clear liquids until 4:15 a.m. the morning of your surgery.   ?Clear liquids allowed are: Water, Non-Citrus Juices (without pulp), Carbonated Beverages, Clear Tea, Black Coffee ONLY (NO MILK, CREAM OR POWDERED CREAMER of any kind), and Gatorade. ? ? ?Enhanced Recovery after Surgery for Orthopedics ?Enhanced Recovery after Surgery is a protocol used to improve the stress on your body and your recovery after surgery. ? ?Patient Instructions ? ?The day of surgery (if you do NOT have diabetes):  ?Drink ONE (1) Pre-Surgery Clear Ensure by 4:15 a.m. the morning of surgery   ?This drink was given to you during your hospital  ?pre-op appointment visit. ?Nothing else to drink after completing the  ?Pre-Surgery Clear Ensure. ? ?       If you have questions, please contact your surgeon?s office. ? ?  ? Take these medicines the morning of surgery with A SIP OF WATER:  ?atorvastatin (LIPITOR)  ? ?Follow your surgeon's instructions on when to stop Aspirin.  If no instructions were given by your surgeon then you will need to call the office to get those instructions.   ? ?As of today, STOP taking any Aleve, Naproxen, Ibuprofen, Motrin, Advil, Goody's, BC's, all herbal medications, fish oil, and all vitamins. ? ?         ?Do not wear jewelry. ?Do not wear lotions, powders, colognes, or deodorant. ?Men may shave face and neck. ?Do not bring valuables to the hospital. ? ?[facility] is not responsible for any belongings or valuables. .  ? ?Do NOT Smoke (Tobacco/Vaping)  24 hours prior to your  procedure ? ?If you use a CPAP at night, you may bring your mask for your overnight stay. ?  ?Contacts, glasses, hearing aids, dentures or partials may not be worn into surgery, please bring cases for these belongings ?  ?For patients admitted to the hospital, discharge time will be determined by your treatment team. ?  ?Patients discharged the day of surgery will not be allowed to drive home, and someone needs to stay with them for 24 hours. ? ? ?SURGICAL WAITING ROOM VISITATION ?Patients having surgery or a procedure in a hospital may have two support people. ?Children under the age of 2 must have an adult with them who is not the patient. ?They may stay in the waiting area during the procedure and may switch out with other visitors. If the patient needs to stay at the hospital during part of their recovery, the visitor guidelines for inpatient rooms apply. ? ?Please refer to the [facility] website for the visitor guidelines for Inpatients (after your surgery is over and you are in a regular room).  ? ? ? ? ? ?Special instructions:   ? ?Oral Hygiene is also important to reduce your risk of infection.  Remember - BRUSH YOUR TEETH THE MORNING OF SURGERY WITH YOUR REGULAR TOOTHPASTE ? ? ?[facility]- Preparing For Surgery ? ?Before surgery, you can play an important role. Because skin is not sterile, your skin needs to be as free of  germs as possible. You can reduce the number of germs on your skin by washing with CHG (chlorahexidine gluconate) Soap before surgery.  CHG is an antiseptic cleaner which kills germs and bonds with the skin to continue killing germs even after washing.   ? ? ?Please do not use if you have an allergy to CHG or antibacterial soaps. If your skin becomes reddened/irritated stop using the CHG.  ?Do not shave (including legs and underarms) for at least 48 hours prior to first CHG shower. It is OK to shave your face. ? ?Please follow these instructions carefully. ?  ? ? Shower the NIGHT BEFORE  SURGERY and the MORNING OF SURGERY with CHG Soap.  ? If you chose to wash your hair, wash your hair first as usual with your normal shampoo. After you shampoo, rinse your hair and body thoroughly to remove the shampoo.  Then [employer] and genitals (private parts) with your normal soap and rinse thoroughly to remove soap. ? ?After that Use CHG Soap as you would any other liquid soap. You can apply CHG directly to the skin and wash gently with a scrungie or a clean washcloth.  ? ?Apply the CHG Soap to your body ONLY FROM THE NECK DOWN.  Do not use on open wounds or open sores. Avoid contact with your eyes, ears, mouth and genitals (private parts). Wash Face and genitals (private parts)  with your normal soap.  ? ?Wash thoroughly, paying special attention to the area where your surgery will be performed. ? ?Thoroughly rinse your body with warm water from the neck down. ? ?DO NOT shower/wash with your normal soap after using and rinsing off the CHG Soap. ? ?Pat yourself dry with a CLEAN TOWEL. ? ?Wear CLEAN PAJAMAS to bed the night before surgery ? ?Place CLEAN SHEETS on your bed the night before your surgery ? ?DO NOT SLEEP WITH PETS. ? ? ?Day of Surgery: ? ?Take a shower with CHG soap. ?Wear Clean/Comfortable clothing the morning of surgery ?Do not apply any deodorants/lotions.   ?Remember to brush your teeth WITH YOUR REGULAR TOOTHPASTE. ? ? ? ?If you received a COVID test during your pre-op visit  it is requested that you wear a mask when out in public, stay away from anyone that may not be feeling well and notify your surgeon if you develop symptoms. If you have been in contact with anyone that has tested positive in the last 10 days please notify you surgeon. ? ?  ?Please read over the following fact sheets that you were given.  ? ?

## 2021-11-07 ENCOUNTER — Encounter (HOSPITAL_COMMUNITY): Payer: Self-pay

## 2021-11-07 ENCOUNTER — Encounter (HOSPITAL_COMMUNITY)
Admission: RE | Admit: 2021-11-07 | Discharge: 2021-11-07 | Disposition: A | Discharge status: Home / Self Care | Payer: BC Managed Care – PPO | Source: Ambulatory Visit | Attending: Orthopaedic Surgery | Admitting: Orthopaedic Surgery

## 2021-11-07 ENCOUNTER — Other Ambulatory Visit: Payer: Self-pay

## 2021-11-07 VITALS — BP 162/102 | HR 61 | Temp 97.7°F | Resp 17 | Ht 70.0 in | Wt 172.6 lb

## 2021-11-07 DIAGNOSIS — Z01818 Encounter for other preprocedural examination: Secondary | ICD-10-CM

## 2021-11-07 DIAGNOSIS — I10 Essential (primary) hypertension: Secondary | ICD-10-CM | POA: Insufficient documentation

## 2021-11-07 DIAGNOSIS — M1611 Unilateral primary osteoarthritis, right hip: Secondary | ICD-10-CM | POA: Diagnosis not present

## 2021-11-07 DIAGNOSIS — Z01812 Encounter for preprocedural laboratory examination: Secondary | ICD-10-CM | POA: Diagnosis present

## 2021-11-07 DIAGNOSIS — I4891 Unspecified atrial fibrillation: Secondary | ICD-10-CM | POA: Insufficient documentation

## 2021-11-07 LAB — CBC WITH DIFFERENTIAL/PLATELET
Abs Immature Granulocytes: 0.01 10*3/uL (ref 0.00–0.07)
Basophils Absolute: 0.1 10*3/uL (ref 0.0–0.1)
Basophils Relative: 1 %
Eosinophils Absolute: 0.1 10*3/uL (ref 0.0–0.5)
Eosinophils Relative: 1 %
HCT: 39.3 % (ref 39.0–52.0)
Hemoglobin: 13.8 g/dL (ref 13.0–17.0)
Immature Granulocytes: 0 %
Lymphocytes Relative: 30 %
Lymphs Abs: 2 10*3/uL (ref 0.7–4.0)
MCH: 34.5 pg — ABNORMAL HIGH (ref 26.0–34.0)
MCHC: 35.1 g/dL (ref 30.0–36.0)
MCV: 98.3 fL (ref 80.0–100.0)
Monocytes Absolute: 0.5 10*3/uL (ref 0.1–1.0)
Monocytes Relative: 7 %
Neutro Abs: 4.1 10*3/uL (ref 1.7–7.7)
Neutrophils Relative %: 61 %
Platelets: 269 10*3/uL (ref 150–400)
RBC: 4 MIL/uL — ABNORMAL LOW (ref 4.22–5.81)
RDW: 11.4 % — ABNORMAL LOW (ref 11.5–15.5)
WBC: 6.8 10*3/uL (ref 4.0–10.5)
nRBC: 0 % (ref 0.0–0.2)

## 2021-11-07 LAB — COMPREHENSIVE METABOLIC PANEL
ALT: 45 U/L — ABNORMAL HIGH (ref 0–44)
AST: 69 U/L — ABNORMAL HIGH (ref 15–41)
Albumin: 4.3 g/dL (ref 3.5–5.0)
Alkaline Phosphatase: 38 U/L (ref 38–126)
Anion gap: 9 (ref 5–15)
BUN: 9 mg/dL (ref 6–20)
CO2: 27 mmol/L (ref 22–32)
Calcium: 9.7 mg/dL (ref 8.9–10.3)
Chloride: 103 mmol/L (ref 98–111)
Creatinine, Ser: 1.13 mg/dL (ref 0.61–1.24)
GFR, Estimated: >60 mL/min (ref >60)
Glucose, Bld: 139 mg/dL — ABNORMAL HIGH (ref 70–99)
Potassium: 4.1 mmol/L (ref 3.5–5.1)
Sodium: 139 mmol/L (ref 135–145)
Total Bilirubin: 1.6 mg/dL — ABNORMAL HIGH (ref 0.3–1.2)
Total Protein: 7 g/dL (ref 6.5–8.1)

## 2021-11-07 LAB — TYPE AND SCREEN
ABO/RH(D): O POS
Antibody Screen: NEGATIVE

## 2021-11-07 LAB — SURGICAL PCR SCREEN

## 2021-11-07 NOTE — Progress Notes (Signed)
Pt left before I had a chance to recheck BP. He said he did take his BP meds this morning but he had 2 ham biscuits on the way here. Fayrene Fearing, Anesthesia PA, made aware of high BP and PAT ?

## 2021-11-07 NOTE — Progress Notes (Signed)
PCP - Dr. [name] ?Cardiologist - Dr. Vernell Leep ? ?PPM/ICD - denies ? ?Chest x-ray - 08/05/13 ?EKG - 10/10/21 ?Stress Test - 2015 per pt, no abnormalities ?ECHO - 10/17/21 ?Cardiac Cath - denies ? ?Sleep Study - denies ? ? ?DM- denies ? ?Blood Thinner Instructions: n/a ?Aspirin Instructions: continue thru day of surgery per cardiologist, per pt ? ?ERAS Protcol - yes ?PRE-SURGERY Ensure given at PAT ? ?COVID TEST- n/a ? ? ?Anesthesia review: yes, cardiac hx ? ?Patient denies shortness of breath, fever, cough and chest pain at PAT appointment ? ? ?All instructions explained to the patient, with a verbal understanding of the material. Patient agrees to go over the instructions while at home for a better understanding. Patient also instructed to notify surgeon of any contact with COVID+ person or if he develops any symptoms. The opportunity to ask questions was provided. ?  ?

## 2021-11-08 NOTE — Progress Notes (Signed)
Invalid surgical PCR from PAT.  Will need to be recollected DOS.  ?

## 2021-11-08 NOTE — Progress Notes (Signed)
Anesthesia Chart Review: ? ?Follows with cardiology for hx of HTN and afib/flutter. Works for [employer] and has good functional status. Last seen by Dr. Virgina Jock 10/10/21 and upcoming surgery discussed. Per note, "Remote history of atrial flutter in 2015.  Patient in A-fib today, unclear if paroxysmal or persistent.  He is completely asymptomatic and well rate controlled. ?We will obtain echocardiogram.  No rhythm control therapy necessary at this time. CHA2DS2VASc score 1, annual stroke risk 0.6%. Okay to continue Aspirin 81 mg for now. Okay to proceed with hip surgery in May 2023, unless significant abnormalities found on echocardiogram." Subsequent echo showed normal LVEF, normal wall motion, mild-mod TR. Pt was noted to be in NSR at the time. Compared to study 08/28/2020 G1DD is now normal otherwise no significant change.  ? ?Hx of HTN, BP noted to be elevated at PAT 162/102. Was better when last seen by Dr. Virgina Jock 10/10/21, 128/79. ? ?Preop labs reviewed, ast/alt mildly elevated 69/45. Labs otherwise unremarkable.  ? ?EKG 10/10/2021: ?Coarse atrial fibrillation 66 bpm ?  ?Echocardiogram 10/17/2021:  ?Normal LV systolic function with visual EF 60-65%. Left ventricle cavity  ?is normal in size. Mild left ventricular hypertrophy. Normal global wall  ?motion. Normal diastolic filling pattern, normal LAP.  ?Mild to moderate tricuspid regurgitation. No evidence of pulmonary  ?hypertension.  ?EKG: NSR.  ? ? ?Karoline Caldwell, PA-C ?[facility] Short Stay Center/Anesthesiology ?Phone [phone] ?11/08/2021 3:37 PM ? ?  ?

## 2021-11-08 NOTE — Anesthesia Preprocedure Evaluation (Addendum)
Anesthesia Evaluation  ?Patient identified by MRN, date of birth, ID band ?Patient awake ? ? ? ?Reviewed: ?Allergy & Precautions, NPO status , Patient's Chart, lab work & pertinent test results, reviewed documented beta blocker date and time  ? ?History of Anesthesia Complications ?Negative for: history of anesthetic complications ? ?Airway ?Mallampati: II ? ?TM Distance: >3 FB ?Neck ROM: Full ? ? ? Dental ?no notable dental hx. ? ?  ?Pulmonary ?neg pulmonary ROS,  ?  ?Pulmonary exam normal ? ? ? ? ? ? ? Cardiovascular ?hypertension, Pt. on home beta blockers and Pt. on medications ?Normal cardiovascular exam+ dysrhythmias Atrial Fibrillation  ? ? ?  ?Neuro/Psych ?Spinal stenosis s/p lumbar laminectomy ?negative psych ROS  ? GI/Hepatic ?negative GI ROS, Neg liver ROS,   ?Endo/Other  ?negative endocrine ROS ? Renal/GU ?negative Renal ROS  ?negative genitourinary ?  ?Musculoskeletal ?right hip avascular necrosis  ? Abdominal ?  ?Peds ? Hematology ?negative hematology ROS ?(+)   ?Anesthesia Other Findings ?Day of surgery medications reviewed with patient. ? Reproductive/Obstetrics ?negative OB ROS ? ?  ? ? ? ? ? ? ? ? ? ? ? ? ? ?  ?  ? ? ? ? ? ? ?Anesthesia Physical ?Anesthesia Plan ? ?ASA: 2 ? ?Anesthesia Plan: Spinal  ? ?Post-op Pain Management: Tylenol PO (pre-op)*  ? ?Induction:  ? ?PONV Risk Score and Plan: 2 and Treatment may vary due to age or medical condition, Ondansetron, Propofol infusion, Dexamethasone and Midazolam ? ?Airway Management Planned: Natural Airway and Simple Face Mask ? ?Additional Equipment: None ? ?Intra-op Plan:  ? ?Post-operative Plan:  ? ?Informed Consent: I have reviewed the patients History and Physical, chart, labs and discussed the procedure including the risks, benefits and alternatives for the proposed anesthesia with the patient or authorized representative who has indicated his/her understanding and acceptance.  ? ? ? ? ? ?Plan Discussed with:  CRNA ? ?Anesthesia Plan Comments: (PAT note by Antionette Poles, PA-C: ?Follows with cardiology for hx of HTN and afib/flutter. Works for [employer] and has good functional status. Last seen by Dr. Rosemary Holms 10/10/21 and upcoming surgery discussed. Per note, "Remote history of atrial flutter in 2015. ?Patient in A-fib today, unclear if paroxysmal or persistent. ?He is completely asymptomatic and well rate controlled. ?We will obtain echocardiogram. ?No rhythm control therapy necessary at this time. CHA2DS2VASc score 1, annual stroke risk 0.6%. Okay to continue Aspirin 81 mg for now. Okay to proceed with hip surgery in May 2023, unless significant abnormalities found on echocardiogram." Subsequent echo showed normal LVEF, normal wall motion, mild-mod TR. Pt was noted to be in NSR at the time. Compared to study 08/28/2020 G1DD is now normal otherwise no significant change.  ? ?Hx of HTN, BP noted to be elevated at PAT 162/102. Was better when last seen by Dr. Rosemary Holms 10/10/21, 128/79. ? ?Preop labs reviewed, ast/alt mildly elevated 69/45. Labs otherwise unremarkable.  ? ?EKG 10/10/2021: ?Coarse atrial fibrillation 66 bpm ?? ?Echocardiogram 10/17/2021:  ?Normal LV systolic function with visual EF 60-65%. Left ventricle cavity  ?is normal in size. Mild left ventricular hypertrophy. Normal global wall  ?motion. Normal diastolic filling pattern, normal LAP.  ?Mild to moderate tricuspid regurgitation. No evidence of pulmonary  ?hypertension.  ?EKG: NSR.  ?)  ? ? ? ? ?Anesthesia Quick Evaluation ? ?

## 2021-11-12 ENCOUNTER — Other Ambulatory Visit: Payer: Self-pay | Admitting: Physician Assistant

## 2021-11-12 MED ORDER — DOCUSATE SODIUM 100 MG PO CAPS: 100.0000 mg | ORAL_CAPSULE | Freq: Every day | ORAL | 2 refills | Status: DC | PRN

## 2021-11-12 MED ORDER — METHOCARBAMOL 500 MG PO TABS: 500.0000 mg | ORAL_TABLET | Freq: Two times a day (BID) | ORAL | 2 refills | Status: DC | PRN

## 2021-11-12 MED ORDER — OXYCODONE-ACETAMINOPHEN 5-325 MG PO TABS: 1.0000 | ORAL_TABLET | Freq: Four times a day (QID) | ORAL | 0 refills | Status: DC | PRN

## 2021-11-12 MED ORDER — ASPIRIN EC 81 MG PO TBEC: 81.0000 mg | DELAYED_RELEASE_TABLET | Freq: Two times a day (BID) | ORAL | 0 refills | Status: DC

## 2021-11-12 MED ORDER — ONDANSETRON HCL 4 MG PO TABS: 4.0000 mg | ORAL_TABLET | Freq: Three times a day (TID) | ORAL | 0 refills | Status: DC | PRN

## 2021-11-13 ENCOUNTER — Telehealth: Payer: Self-pay | Admitting: Orthopaedic Surgery

## 2021-11-13 NOTE — Telephone Encounter (Signed)
Received out of work note and S/T disability form report of continued disability/Forwarding to [place] today ?

## 2021-11-16 MED ORDER — TRANEXAMIC ACID 1000 MG/10ML IV SOLN
2000.0000 mg | INTRAVENOUS | Status: DC
  Filled 2021-11-16: qty 20

## 2021-11-19 ENCOUNTER — Encounter (HOSPITAL_COMMUNITY)
Admission: RE | Disposition: A | Discharge status: Home Health | Payer: Self-pay | Source: Ambulatory Visit | Attending: Orthopaedic Surgery

## 2021-11-19 ENCOUNTER — Ambulatory Visit (HOSPITAL_COMMUNITY): Payer: BC Managed Care – PPO | Admitting: Physician Assistant

## 2021-11-19 ENCOUNTER — Other Ambulatory Visit: Payer: Self-pay

## 2021-11-19 ENCOUNTER — Observation Stay (HOSPITAL_COMMUNITY)
Admission: RE | Admit: 2021-11-19 | Discharge: 2021-11-20 | Disposition: A | Discharge status: Home Health | Payer: BC Managed Care – PPO | Source: Ambulatory Visit | Attending: Orthopaedic Surgery | Admitting: Orthopaedic Surgery

## 2021-11-19 ENCOUNTER — Ambulatory Visit (HOSPITAL_COMMUNITY): Payer: BC Managed Care – PPO

## 2021-11-19 ENCOUNTER — Encounter (HOSPITAL_COMMUNITY): Payer: Self-pay | Admitting: Orthopaedic Surgery

## 2021-11-19 ENCOUNTER — Observation Stay (HOSPITAL_COMMUNITY): Payer: BC Managed Care – PPO

## 2021-11-19 DIAGNOSIS — Z01818 Encounter for other preprocedural examination: Secondary | ICD-10-CM

## 2021-11-19 DIAGNOSIS — M87051 Idiopathic aseptic necrosis of right femur: Secondary | ICD-10-CM | POA: Diagnosis present

## 2021-11-19 DIAGNOSIS — I10 Essential (primary) hypertension: Secondary | ICD-10-CM | POA: Diagnosis not present

## 2021-11-19 DIAGNOSIS — Z7982 Long term (current) use of aspirin: Secondary | ICD-10-CM | POA: Insufficient documentation

## 2021-11-19 DIAGNOSIS — Z79899 Other long term (current) drug therapy: Secondary | ICD-10-CM | POA: Diagnosis not present

## 2021-11-19 DIAGNOSIS — M87059 Idiopathic aseptic necrosis of unspecified femur: Secondary | ICD-10-CM | POA: Diagnosis present

## 2021-11-19 DIAGNOSIS — M1611 Unilateral primary osteoarthritis, right hip: Secondary | ICD-10-CM

## 2021-11-19 DIAGNOSIS — Z96641 Presence of right artificial hip joint: Secondary | ICD-10-CM

## 2021-11-19 HISTORY — PX: TOTAL HIP ARTHROPLASTY: SHX124

## 2021-11-19 LAB — ABO/RH: ABO/RH(D): O POS

## 2021-11-19 LAB — SURGICAL PCR SCREEN
MRSA, PCR: NEGATIVE
Staphylococcus aureus: NEGATIVE

## 2021-11-19 SURGERY — ARTHROPLASTY, HIP, TOTAL, ANTERIOR APPROACH
Anesthesia: Spinal | Site: Hip | Laterality: Right

## 2021-11-19 MED ORDER — DEXAMETHASONE SODIUM PHOSPHATE 10 MG/ML IJ SOLN
10.0000 mg | Freq: Once | INTRAMUSCULAR | Status: AC
  Administered 2021-11-20: 10 mg via INTRAVENOUS
  Filled 2021-11-19: qty 1

## 2021-11-19 MED ORDER — PROPOFOL 500 MG/50ML IV EMUL
INTRAVENOUS | Status: DC | PRN
Start: 2021-11-19 — End: 2021-11-19
  Administered 2021-11-19: 50 ug/kg/min via INTRAVENOUS

## 2021-11-19 MED ORDER — VANCOMYCIN HCL 1000 MG IV SOLR
INTRAVENOUS | Status: AC
  Filled 2021-11-19: qty 20

## 2021-11-19 MED ORDER — METHOCARBAMOL 500 MG PO TABS
500.0000 mg | ORAL_TABLET | Freq: Four times a day (QID) | ORAL | Status: DC | PRN
  Administered 2021-11-19 – 2021-11-20 (×2): 500 mg via ORAL
  Filled 2021-11-19 (×2): qty 1

## 2021-11-19 MED ORDER — SODIUM CHLORIDE 0.9 % IV SOLN: INTRAVENOUS | Status: DC

## 2021-11-19 MED ORDER — CHLORHEXIDINE GLUCONATE 0.12 % MT SOLN: 15.0000 mL | Freq: Once | OROMUCOSAL | Status: AC

## 2021-11-19 MED ORDER — FERROUS SULFATE 325 (65 FE) MG PO TABS
325.0000 mg | ORAL_TABLET | Freq: Three times a day (TID) | ORAL | Status: DC
  Administered 2021-11-19 – 2021-11-20 (×3): 325 mg via ORAL
  Filled 2021-11-19 (×3): qty 1

## 2021-11-19 MED ORDER — SORBITOL 70 % SOLN: 30.0000 mL | Freq: Every day | Status: DC | PRN

## 2021-11-19 MED ORDER — ACETAMINOPHEN 500 MG PO TABS: 1000.0000 mg | ORAL_TABLET | Freq: Once | ORAL | Status: AC

## 2021-11-19 MED ORDER — MIDAZOLAM HCL 2 MG/2ML IJ SOLN
INTRAMUSCULAR | Status: DC | PRN
  Administered 2021-11-19: 2 mg via INTRAVENOUS

## 2021-11-19 MED ORDER — TRANEXAMIC ACID-NACL 1000-0.7 MG/100ML-% IV SOLN
1000.0000 mg | Freq: Once | INTRAVENOUS | Status: AC
  Administered 2021-11-19: 1000 mg via INTRAVENOUS
  Filled 2021-11-19: qty 100

## 2021-11-19 MED ORDER — OXYCODONE HCL 5 MG PO TABS
5.0000 mg | ORAL_TABLET | ORAL | Status: DC | PRN
  Administered 2021-11-20: 10 mg via ORAL
  Filled 2021-11-19: qty 2

## 2021-11-19 MED ORDER — MIDAZOLAM HCL 2 MG/2ML IJ SOLN
INTRAMUSCULAR | Status: AC
  Filled 2021-11-19: qty 2

## 2021-11-19 MED ORDER — VANCOMYCIN HCL 1 G IV SOLR
INTRAVENOUS | Status: DC | PRN
  Administered 2021-11-19: 1000 mg

## 2021-11-19 MED ORDER — DOCUSATE SODIUM 100 MG PO CAPS
100.0000 mg | ORAL_CAPSULE | Freq: Two times a day (BID) | ORAL | Status: DC
  Administered 2021-11-19 (×2): 100 mg via ORAL
  Filled 2021-11-19 (×2): qty 1

## 2021-11-19 MED ORDER — ALUM & MAG HYDROXIDE-SIMETH 200-200-20 MG/5ML PO SUSP: 30.0000 mL | ORAL | Status: DC | PRN

## 2021-11-19 MED ORDER — HYDROCHLOROTHIAZIDE 12.5 MG PO TABS
12.5000 mg | ORAL_TABLET | Freq: Every day | ORAL | Status: DC
  Administered 2021-11-19: 12.5 mg via ORAL
  Filled 2021-11-19: qty 1

## 2021-11-19 MED ORDER — ONDANSETRON HCL 4 MG/2ML IJ SOLN
INTRAMUSCULAR | Status: DC | PRN
  Administered 2021-11-19: 4 mg via INTRAVENOUS

## 2021-11-19 MED ORDER — LACTATED RINGERS IV SOLN: INTRAVENOUS | Status: DC

## 2021-11-19 MED ORDER — LISINOPRIL-HYDROCHLOROTHIAZIDE 10-12.5 MG PO TABS: 1.0000 | ORAL_TABLET | Freq: Every morning | ORAL | Status: DC

## 2021-11-19 MED ORDER — ASPIRIN 81 MG PO CHEW
81.0000 mg | CHEWABLE_TABLET | Freq: Two times a day (BID) | ORAL | Status: DC
  Administered 2021-11-19: 81 mg via ORAL
  Filled 2021-11-19: qty 1

## 2021-11-19 MED ORDER — DIPHENHYDRAMINE HCL 12.5 MG/5ML PO ELIX: 25.0000 mg | ORAL_SOLUTION | ORAL | Status: DC | PRN

## 2021-11-19 MED ORDER — POLYETHYLENE GLYCOL 3350 17 G PO PACK
17.0000 g | PACK | Freq: Every day | ORAL | Status: DC
  Administered 2021-11-19: 17 g via ORAL
  Filled 2021-11-19: qty 1

## 2021-11-19 MED ORDER — PRONTOSAN WOUND IRRIGATION OPTIME
TOPICAL | Status: DC | PRN
  Administered 2021-11-19: 1 via TOPICAL

## 2021-11-19 MED ORDER — ONDANSETRON HCL 4 MG PO TABS: 4.0000 mg | ORAL_TABLET | Freq: Four times a day (QID) | ORAL | Status: DC | PRN

## 2021-11-19 MED ORDER — BUPIVACAINE-MELOXICAM ER 400-12 MG/14ML IJ SOLN
INTRAMUSCULAR | Status: DC | PRN
  Administered 2021-11-19: 400 mg

## 2021-11-19 MED ORDER — TRANEXAMIC ACID-NACL 1000-0.7 MG/100ML-% IV SOLN
1000.0000 mg | INTRAVENOUS | Status: AC
  Administered 2021-11-19: 1000 mg via INTRAVENOUS
  Filled 2021-11-19: qty 100

## 2021-11-19 MED ORDER — CHLORHEXIDINE GLUCONATE 0.12 % MT SOLN
OROMUCOSAL | Status: AC
  Administered 2021-11-19: 15 mL via OROMUCOSAL
  Filled 2021-11-19: qty 15

## 2021-11-19 MED ORDER — SODIUM CHLORIDE 0.9 % IR SOLN
Status: DC | PRN
  Administered 2021-11-19: 1000 mL

## 2021-11-19 MED ORDER — HYDROMORPHONE HCL 1 MG/ML IJ SOLN: 0.2500 mg | INTRAMUSCULAR | Status: DC | PRN

## 2021-11-19 MED ORDER — TRANEXAMIC ACID 1000 MG/10ML IV SOLN
INTRAVENOUS | Status: DC | PRN
  Administered 2021-11-19: 2000 mg via TOPICAL

## 2021-11-19 MED ORDER — ONDANSETRON HCL 4 MG/2ML IJ SOLN: 4.0000 mg | Freq: Four times a day (QID) | INTRAMUSCULAR | Status: DC | PRN

## 2021-11-19 MED ORDER — 0.9 % SODIUM CHLORIDE (POUR BTL) OPTIME
TOPICAL | Status: DC | PRN
  Administered 2021-11-19: 1000 mL

## 2021-11-19 MED ORDER — PANTOPRAZOLE SODIUM 40 MG PO TBEC
40.0000 mg | DELAYED_RELEASE_TABLET | Freq: Every day | ORAL | Status: DC
  Administered 2021-11-19: 40 mg via ORAL
  Filled 2021-11-19: qty 1

## 2021-11-19 MED ORDER — METHOCARBAMOL 1000 MG/10ML IJ SOLN
500.0000 mg | Freq: Four times a day (QID) | INTRAVENOUS | Status: DC | PRN
  Filled 2021-11-19: qty 5

## 2021-11-19 MED ORDER — DROPERIDOL 2.5 MG/ML IJ SOLN: 0.6250 mg | Freq: Once | INTRAMUSCULAR | Status: DC | PRN

## 2021-11-19 MED ORDER — BUPIVACAINE-MELOXICAM ER 400-12 MG/14ML IJ SOLN
INTRAMUSCULAR | Status: AC
  Filled 2021-11-19: qty 1

## 2021-11-19 MED ORDER — PHENOL 1.4 % MT LIQD: 1.0000 | OROMUCOSAL | Status: DC | PRN

## 2021-11-19 MED ORDER — LISINOPRIL 10 MG PO TABS
10.0000 mg | ORAL_TABLET | Freq: Every day | ORAL | Status: DC
  Administered 2021-11-19: 10 mg via ORAL
  Filled 2021-11-19: qty 1

## 2021-11-19 MED ORDER — PHENYLEPHRINE HCL-NACL 20-0.9 MG/250ML-% IV SOLN
INTRAVENOUS | Status: DC | PRN
  Administered 2021-11-19: 40 ug/min via INTRAVENOUS

## 2021-11-19 MED ORDER — CEFAZOLIN SODIUM-DEXTROSE 2-4 GM/100ML-% IV SOLN
2.0000 g | INTRAVENOUS | Status: AC
  Administered 2021-11-19: 2 g via INTRAVENOUS

## 2021-11-19 MED ORDER — ACETAMINOPHEN 500 MG PO TABS
ORAL_TABLET | ORAL | Status: AC
  Administered 2021-11-19: 1000 mg via ORAL
  Filled 2021-11-19: qty 2

## 2021-11-19 MED ORDER — OXYCODONE HCL 5 MG PO TABS
10.0000 mg | ORAL_TABLET | ORAL | Status: DC | PRN
  Administered 2021-11-20: 10 mg via ORAL
  Filled 2021-11-19: qty 2

## 2021-11-19 MED ORDER — DEXAMETHASONE SODIUM PHOSPHATE 10 MG/ML IJ SOLN
INTRAMUSCULAR | Status: DC | PRN
  Administered 2021-11-19: 10 mg via INTRAVENOUS

## 2021-11-19 MED ORDER — ACETAMINOPHEN 500 MG PO TABS
1000.0000 mg | ORAL_TABLET | Freq: Four times a day (QID) | ORAL | Status: AC
Start: 2021-11-19 — End: 2021-11-20
  Administered 2021-11-19 – 2021-11-20 (×4): 1000 mg via ORAL
  Filled 2021-11-19 (×4): qty 2

## 2021-11-19 MED ORDER — CEFAZOLIN SODIUM-DEXTROSE 2-4 GM/100ML-% IV SOLN
INTRAVENOUS | Status: AC
Start: 2021-11-19 — End: 2021-11-19
  Filled 2021-11-19: qty 100

## 2021-11-19 MED ORDER — OXYCODONE HCL ER 10 MG PO T12A
10.0000 mg | EXTENDED_RELEASE_TABLET | Freq: Two times a day (BID) | ORAL | Status: DC
  Administered 2021-11-19 (×2): 10 mg via ORAL
  Filled 2021-11-19 (×2): qty 1

## 2021-11-19 MED ORDER — BUPIVACAINE IN DEXTROSE 0.75-8.25 % IT SOLN
INTRATHECAL | Status: DC | PRN
  Administered 2021-11-19: 1.8 mL via INTRATHECAL

## 2021-11-19 MED ORDER — MENTHOL 3 MG MT LOZG: 1.0000 | LOZENGE | OROMUCOSAL | Status: DC | PRN

## 2021-11-19 MED ORDER — POVIDONE-IODINE 10 % EX SWAB: 2.0000 "application " | Freq: Once | CUTANEOUS | Status: DC

## 2021-11-19 MED ORDER — HYDROMORPHONE HCL 1 MG/ML IJ SOLN: 0.5000 mg | INTRAMUSCULAR | Status: DC | PRN

## 2021-11-19 MED ORDER — NEBIVOLOL HCL 10 MG PO TABS
20.0000 mg | ORAL_TABLET | Freq: Every day | ORAL | Status: DC
  Administered 2021-11-19: 20 mg via ORAL
  Filled 2021-11-19: qty 2

## 2021-11-19 MED ORDER — CEFAZOLIN SODIUM-DEXTROSE 2-4 GM/100ML-% IV SOLN
2.0000 g | Freq: Four times a day (QID) | INTRAVENOUS | Status: AC
  Administered 2021-11-19 (×2): 2 g via INTRAVENOUS
  Filled 2021-11-19 (×2): qty 100

## 2021-11-19 MED ORDER — METOCLOPRAMIDE HCL 5 MG/ML IJ SOLN: 5.0000 mg | Freq: Three times a day (TID) | INTRAMUSCULAR | Status: DC | PRN

## 2021-11-19 MED ORDER — ORAL CARE MOUTH RINSE: 15.0000 mL | Freq: Once | OROMUCOSAL | Status: AC

## 2021-11-19 MED ORDER — PROPOFOL 10 MG/ML IV BOLUS
INTRAVENOUS | Status: AC
  Filled 2021-11-19: qty 20

## 2021-11-19 MED ORDER — METOCLOPRAMIDE HCL 5 MG PO TABS: 5.0000 mg | ORAL_TABLET | Freq: Three times a day (TID) | ORAL | Status: DC | PRN

## 2021-11-19 MED ORDER — FENTANYL CITRATE (PF) 250 MCG/5ML IJ SOLN
INTRAMUSCULAR | Status: DC | PRN
  Administered 2021-11-19: 50 ug via INTRAVENOUS

## 2021-11-19 MED ORDER — FENTANYL CITRATE (PF) 250 MCG/5ML IJ SOLN
INTRAMUSCULAR | Status: AC
  Filled 2021-11-19: qty 5

## 2021-11-19 MED ORDER — ACETAMINOPHEN 325 MG PO TABS: 325.0000 mg | ORAL_TABLET | Freq: Four times a day (QID) | ORAL | Status: DC | PRN

## 2021-11-19 SURGICAL SUPPLY — 68 items
ACETAB CUP W/GRIPTION 54 (Plate) ×2 IMPLANT
BAG COUNTER SPONGE SURGICOUNT (BAG) ×2 IMPLANT
BAG DECANTER FOR FLEXI CONT (MISCELLANEOUS) ×2 IMPLANT
CELLS DAT CNTRL 66122 CELL SVR (MISCELLANEOUS) IMPLANT
COVER PERINEAL POST (MISCELLANEOUS) ×2 IMPLANT
COVER SURGICAL LIGHT HANDLE (MISCELLANEOUS) ×2 IMPLANT
CUP ACETAB W/GRIPTION 54 (Plate) IMPLANT
DRAPE C-ARM 42X72 X-RAY (DRAPES) ×2 IMPLANT
DRAPE POUCH INSTRU U-SHP 10X18 (DRAPES) ×2 IMPLANT
DRAPE STERI IOBAN 125X83 (DRAPES) ×2 IMPLANT
DRAPE U-SHAPE 47X51 STRL (DRAPES) ×4 IMPLANT
DRSG AQUACEL AG ADV 3.5X10 (GAUZE/BANDAGES/DRESSINGS) ×2 IMPLANT
DURAPREP 26ML APPLICATOR (WOUND CARE) ×4 IMPLANT
ELECT BLADE 4.0 EZ CLEAN MEGAD (MISCELLANEOUS) ×2
ELECT REM PT RETURN 9FT ADLT (ELECTROSURGICAL) ×2
ELECTRODE BLDE 4.0 EZ CLN MEGD (MISCELLANEOUS) ×1 IMPLANT
ELECTRODE REM PT RTRN 9FT ADLT (ELECTROSURGICAL) ×1 IMPLANT
GLOVE BIOGEL PI IND STRL 7.0 (GLOVE) ×1 IMPLANT
GLOVE BIOGEL PI IND STRL 7.5 (GLOVE) ×4 IMPLANT
GLOVE BIOGEL PI INDICATOR 7.0 (GLOVE) ×1
GLOVE BIOGEL PI INDICATOR 7.5 (GLOVE) ×4
GLOVE ECLIPSE 7.0 STRL STRAW (GLOVE) ×4 IMPLANT
GLOVE SKINSENSE NS SZ7.5 (GLOVE) ×1
GLOVE SKINSENSE STRL SZ7.5 (GLOVE) ×1 IMPLANT
GLOVE SURG UNDER POLY LF SZ7 (GLOVE) ×2 IMPLANT
GLOVE SURG UNDER POLY LF SZ7.5 (GLOVE) ×4 IMPLANT
GOWN STRL REIN XL XLG (GOWN DISPOSABLE) ×2 IMPLANT
GOWN STRL REUS W/ TWL LRG LVL3 (GOWN DISPOSABLE) IMPLANT
GOWN STRL REUS W/ TWL XL LVL3 (GOWN DISPOSABLE) ×1 IMPLANT
GOWN STRL REUS W/TWL LRG LVL3 (GOWN DISPOSABLE)
GOWN STRL REUS W/TWL XL LVL3 (GOWN DISPOSABLE) ×1
HANDPIECE INTERPULSE COAX TIP (DISPOSABLE) ×1
HEAD CERAMIC DELTA 36 PLUS 1.5 (Hips) ×1 IMPLANT
HOOD PEEL AWAY FLYTE STAYCOOL (MISCELLANEOUS) ×5 IMPLANT
IV NS IRRIG 3000ML ARTHROMATIC (IV SOLUTION) ×1 IMPLANT
JET LAVAGE IRRISEPT WOUND (IRRIGATION / IRRIGATOR)
KIT BASIN OR (CUSTOM PROCEDURE TRAY) ×2 IMPLANT
LAVAGE JET IRRISEPT WOUND (IRRIGATION / IRRIGATOR) ×1 IMPLANT
LINER NEUTRAL 54X36MM PLUS 4 (Hips) ×1 IMPLANT
MARKER SKIN DUAL TIP RULER LAB (MISCELLANEOUS) ×2 IMPLANT
NDL SPNL 18GX3.5 QUINCKE PK (NEEDLE) ×1 IMPLANT
NEEDLE SPNL 18GX3.5 QUINCKE PK (NEEDLE) ×2 IMPLANT
PACK TOTAL JOINT (CUSTOM PROCEDURE TRAY) ×2 IMPLANT
PACK UNIVERSAL I (CUSTOM PROCEDURE TRAY) ×2 IMPLANT
RETRACTOR WND ALEXIS 18 MED (MISCELLANEOUS) IMPLANT
RTRCTR WOUND ALEXIS 18CM MED (MISCELLANEOUS)
SAW OSC TIP CART 19.5X105X1.3 (SAW) ×2 IMPLANT
SET HNDPC FAN SPRY TIP SCT (DISPOSABLE) ×1 IMPLANT
SOLUTION PRONTOSAN WOUND 350ML (IRRIGATION / IRRIGATOR) ×1 IMPLANT
SPONGE T-LAP 18X18 ~~LOC~~+RFID (SPONGE) ×2 IMPLANT
STAPLER VISISTAT 35W (STAPLE) IMPLANT
STEM FEM ACTIS STD SZ7 (Nail) ×1 IMPLANT
STRIP CLOSURE SKIN 1/2X4 (GAUZE/BANDAGES/DRESSINGS) ×1 IMPLANT
SUT ETHIBOND 2 V 37 (SUTURE) ×2 IMPLANT
SUT MNCRL AB 3-0 PS2 27 (SUTURE) ×1 IMPLANT
SUT VIC AB 0 CT1 27 (SUTURE) ×1
SUT VIC AB 0 CT1 27XBRD ANBCTR (SUTURE) ×1 IMPLANT
SUT VIC AB 1 CTX 36 (SUTURE) ×1
SUT VIC AB 1 CTX36XBRD ANBCTR (SUTURE) ×1 IMPLANT
SUT VIC AB 2-0 CT1 27 (SUTURE) ×2
SUT VIC AB 2-0 CT1 TAPERPNT 27 (SUTURE) ×2 IMPLANT
SYR 50ML LL SCALE MARK (SYRINGE) ×2 IMPLANT
TOWEL GREEN STERILE (TOWEL DISPOSABLE) ×2 IMPLANT
TRAY CATH 16FR W/PLASTIC CATH (SET/KITS/TRAYS/PACK) IMPLANT
TRAY FOLEY W/BAG SLVR 16FR (SET/KITS/TRAYS/PACK) ×1
TRAY FOLEY W/BAG SLVR 16FR ST (SET/KITS/TRAYS/PACK) ×1 IMPLANT
WARMER LAPAROSCOPE (MISCELLANEOUS) ×1 IMPLANT
YANKAUER SUCT BULB TIP NO VENT (SUCTIONS) ×2 IMPLANT

## 2021-11-19 NOTE — Anesthesia Procedure Notes (Signed)
Spinal ? ?Patient location during procedure: OR ?Start time: 11/19/2021 7:19 AM ?End time: 11/19/2021 7:22 AM ?Reason for block: surgical anesthesia ?Staffing ?Performed: anesthesiologist  ?Anesthesiologist: Kaylyn Layer, MD ?Preanesthetic Checklist ?Completed: patient identified, IV checked, risks and benefits discussed, surgical consent, monitors and equipment checked, pre-op evaluation and timeout performed ?Spinal Block ?Patient position: sitting ?Prep: DuraPrep and site prepped and draped ?Patient monitoring: continuous pulse ox, blood pressure and heart rate ?Approach: midline ?Location: L3-4 ?Injection technique: single-shot ?Needle ?Needle type: Pencan  ?Needle gauge: 24 G ?Needle length: 9 cm ?Assessment ?Events: CSF return ?Additional Notes ?Risks, benefits, and alternative discussed. Patient gave consent to procedure. Prepped and draped in sitting position. Patient sedated but responsive to voice. Clear CSF obtained after one needle pass. Positive terminal aspiration. No pain or paraesthesias with injection. Patient tolerated procedure well. Vital signs stable. Amalia Greenhouse, MD ? ? ? ? ?

## 2021-11-19 NOTE — Anesthesia Postprocedure Evaluation (Signed)
Anesthesia Post Note ? ?Patient: Anthony James ? ?Procedure(s) Performed: RIGHT TOTAL HIP REPLACEMENT (Right: Hip) ? ?  ? ?Patient location during evaluation: PACU ?Anesthesia Type: Spinal ?Level of consciousness: awake and alert ?Pain management: pain level controlled ?Vital Signs Assessment: post-procedure vital signs reviewed and stable ?Respiratory status: spontaneous breathing, nonlabored ventilation and respiratory function stable ?Cardiovascular status: blood pressure returned to baseline ?Postop Assessment: no apparent nausea or vomiting and spinal receding ?Anesthetic complications: no ? ? ?No notable events documented. ? ?Last Vitals:  ?Vitals:  ? 11/19/21 1007 11/19/21 1022  ?BP: (!) 155/94 (!) 145/97  ?Pulse: (!) 45 62  ?Resp: 13 11  ?Temp:    ?SpO2: 100% 99%  ?  ?Last Pain:  ?Vitals:  ? 11/19/21 1022  ?TempSrc:   ?PainSc: 0-No pain  ? ? ?  ?  ?  ?  ?  ?  ? ?Marthenia Rolling ? ? ? ? ?

## 2021-11-19 NOTE — Discharge Instructions (Signed)

## 2021-11-19 NOTE — Transfer of Care (Signed)
Immediate Anesthesia Transfer of Care Note ? ?Patient: Anthony James ? ?Procedure(s) Performed: RIGHT TOTAL HIP REPLACEMENT (Right: Hip) ? ?Patient Location: PACU ? ?Anesthesia Type:MAC and Spinal ? ?Level of Consciousness: awake and drowsy ? ?Airway & Oxygen Therapy: Patient Spontanous Breathing ? ?Post-op Assessment: Report given to RN and Post -op Vital signs reviewed and stable ? ?Post vital signs: Reviewed and stable ? ?Last Vitals:  ?Vitals Value Taken Time  ?BP 105/73 11/19/21 0906  ?Temp    ?Pulse 56 11/19/21 0906  ?Resp 15 11/19/21 0906  ?SpO2 98 % 11/19/21 0906  ?Vitals shown include unvalidated device data. ? ?Last Pain:  ?Vitals:  ? 11/19/21 0558  ?TempSrc:   ?PainSc: 0-No pain  ?   ? ?  ? ?Complications: No notable events documented. ?

## 2021-11-19 NOTE — TOC Progression Note (Addendum)
Transition of Care (TOC) - Progression Note  ? ? ?Patient Details  ?Name: Nivek Powley ?MRN: [id number] ?Date of Birth: Aug 30, 1965 ? ?Transition of Care (TOC) CM/SW Contact  ?Kingsley Plan, RN ?Phone Number: ?11/19/2021, 1:54 PM ? ?Clinical Narrative:    ? ?Spoke to Calio with [facility] , they can accept for home health services, as long as this surgery is not related to a workers comp claim. NCM spoke to patient. Patient states surgery is not related to a workers comp claim . Clifton Custard with [facility] aware and can accept referral.  ? ?3C staff with provide DME  ? ? ?Transition of Care (TOC) Screening Note ? ? ?Patient Details  ?Name: Windel Keziah ?Date of Birth: April 14, 1966 ? ? ? ? ?Transition of Care Department [facility]) has reviewed patient and no TOC needs have been identified at this time. We will continue to monitor patient advancement through interdisciplinary progression rounds. If new patient transition needs arise, please place a TOC consult. ?  ? ?  ?  ? ?Expected Discharge Plan and Services ?  ?  ?  ?  ?  ?                ?  ?  ?  ?  ?  ?  ?  ?  ?  ?  ? ? ?Social Determinants of Health (SDOH) Interventions ?  ? ?Readmission Risk Interventions ?   ? View : No data to display.  ?  ?  ?  ? ? ?

## 2021-11-19 NOTE — Op Note (Signed)
? ?RIGHT TOTAL HIP REPLACEMENT  Procedure Note ?Anthony James   [id number] ? ?Pre-op Diagnosis: right hip avascular necrosis ?    ?Post-op Diagnosis: same ?  ?Operative Procedures  ?1. Total hip replacement; Right hip; uncemented [id number]  ? ?Surgeon: Frankey Shown, M.D. ? ?Assist: Madalyn Rob, PA-C ?  ?Anesthesia: spinal ? ?Prosthesis: Depuy ?Acetabulum: Pinnacle 54 mm ?Femur: Actis 7 STD ?Head: 36 mm size: +1.5 ?Liner: +4 ?Bearing Type: ceramic/poly ? ?Total Hip Arthroplasty (Anterior Approach) Op Note:  ?After informed consent was obtained and the operative extremity marked in the holding area, the patient was brought back to the operating room and placed supine on the HANA table. Next, the operative extremity was prepped and draped in normal sterile fashion. Surgical timeout occurred verifying patient identification, surgical site, surgical procedure and administration of antibiotics.  ?A modified anterior Smith-Peterson approach to the hip was performed, using the interval between tensor fascia lata and sartorius.  Dissection was carried bluntly down onto the anterior hip capsule. The lateral femoral circumflex vessels were identified and coagulated. A capsulotomy was performed and the capsular flaps tagged for later repair.  The neck osteotomy was performed. The femoral head was removed, the acetabular rim was cleared of soft tissue and attention was turned to reaming the acetabulum.  ?Sequential reaming was performed under fluoroscopic guidance. We reamed to a size 53 mm, and then impacted the acetabular shell which had excellent fit.  The liner was then placed after irrigation and attention turned to the femur.  ?After placing the femoral hook, the leg was taken to externally rotated, extended and adducted position taking care to perform soft tissue releases to allow for adequate mobilization of the femur. Soft tissue was cleared from the shoulder of the greater trochanter and the hook elevator used to  improve exposure of the proximal femur. Sequential broaching performed up to a size 7. Trial neck and head were placed. The leg was brought back up to neutral and the construct reduced.  Antibiotic irrigation was placed in the surgical wound and kept for at least 1 minute.  The position and sizing of components, offset and leg lengths were checked using fluoroscopy. Stability of the construct was checked in extension and external rotation without any subluxation or impingement of prosthesis. We dislocated the prosthesis, dropped the leg back into position, removed trial components, and irrigated copiously. The final stem and head was then placed, the leg brought back up, the system reduced and fluoroscopy used to verify positioning.  ?We irrigated, obtained hemostasis and closed the capsule using #2 ethibond suture.  One gram of vancomycin powder was placed in the surgical bed.   One gram of topical tranexamic acid was injected into the joint.  The fascia was closed with #1 vicryl plus, the deep fat layer was closed with 0 vicryl, the subcutaneous layers closed with 2.0 Vicryl Plus and the skin closed with 3.0 monocryl and steri strips. A sterile dressing was applied. The patient was awakened in the operating room and taken to recovery in stable condition.  ?All sponge, needle, and instrument counts were correct at the end of the case.  ? ?Anthony James, my PA, was a medical necessity for opening, closing, limb positioning, retracting, exposing, and overall facilitation and timely completion of the surgery. ? ?Position: supine  ?Complications: see description of procedure.  ?Time Out: performed  ? ?Drains/Packing: none ? ?Estimated blood loss: see anesthesia record ? ?Returned to Recovery Room: in good condition.  ? ?Antibiotics: yes  ? ?  Mechanical VTE (DVT) Prophylaxis: sequential compression devices, TED thigh-high  ?Chemical VTE (DVT) Prophylaxis: aspirin  ? ?Fluid Replacement: see anesthesia  record ? ?Specimens Removed: 1 to pathology  ? ?Sponge and Instrument Count Correct? yes  ? ?PACU: portable radiograph - low AP  ? ?Plan/RTC: Return in 2 weeks for staple removal. ?Weight Bearing/Load Lower Extremity: full  ?Hip precautions: none ?Suture Removal: 2 weeks  ? ?N. Eduard Roux, MD ?Marga Hoots ?8:33 AM ? ? ?Implant Name Type Inv. Item Serial No. Manufacturer Lot No. LRB No. Used Action  ?Enzo Montgomery 54MM - [id number] Plate [place] 54MM  [place] [id number] Right 1 Implanted  ?LINER NEUTRAL 54X36MM PLUS 4 - [id number] Hips LINER NEUTRAL 54X36MM PLUS 4  [employer] [id number] Right 1 Implanted  ?STEM FEM ACTIS STD SZ7 - [id number] Nail STEM FEM ACTIS STD SZ7  [employer] [id number] Right 1 Implanted  ?HEAD CERAMIC DELTA 36 PLUS 1.5 - [id number] Hips HEAD CERAMIC DELTA 36 PLUS 1.5  [employer] [id number] Right 1 Implanted  ? ? ?

## 2021-11-19 NOTE — Anesthesia Procedure Notes (Signed)
Procedure Name: Amity ?Date/Time: 11/19/2021 7:16 AM ?Performed by: Dorann Lodge, CRNA ?Pre-anesthesia Checklist: Patient identified, Emergency Drugs available, Suction available, Patient being monitored and Timeout performed ?Patient Re-evaluated:Patient Re-evaluated prior to induction ?Oxygen Delivery Method: Simple face mask ? ? ? ? ?

## 2021-11-19 NOTE — H&P (Signed)
? ? ?PREOPERATIVE H&P ? ?Chief Complaint: right hip avascular necrosis ? ?HPI: ?Anthony James is a 56 y.o. male who presents for surgical treatment of right hip avascular necrosis.  He denies any changes in medical history. ? ?Past Medical History:  ?Diagnosis Date  ? Atrial flutter ([place])   ? 08/05/2013  ? Dysrhythmia   ? Hypertension   ? ?Past Surgical History:  ?Procedure Laterality Date  ? LUMBAR LAMINECTOMY/DECOMPRESSION MICRODISCECTOMY N/A 09/07/2014  ? Procedure: MICRO LUMBAR DECOMPRESSION L4-5;  Surgeon: Johnn Hai, MD;  Location: [facility];  Service: Orthopedics;  Laterality: N/A;  ? surgery for hole in retinas    ? 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 WISDOM TOOTH EXTRACTION    ? ?Social History  ? ?Socioeconomic History  ? Marital status: Single  ?  Spouse name: Not on file  ? Number of children: 0  ? Years of education: Not on file  ? Highest education level: Not on file  ?Occupational History  ? Not on file  ?Tobacco Use  ? Smoking status: Never  ? Smokeless tobacco: Never  ?Vaping Use  ? Vaping Use: Never used  ?Substance and Sexual Activity  ? Alcohol use: Yes  ?  Alcohol/week: 3.0 - 4.0 standard drinks  ?  Types: 3 - 4 Cans of beer per week  ? Drug use: No  ? Sexual activity: Not on file  ?Other Topics Concern  ? Not on file  ?Social History Narrative  ? Not on file  ? ?Social Determinants of Health  ? ?Financial Resource Strain: Not on file  ?Food Insecurity: Not on file  ?Transportation Needs: Not on file  ?Physical Activity: Not on file  ?Stress: Not on file  ?Social Connections: Not on file  ? ?No family history on file. ?No Known Allergies ?Prior to Admission medications   ?Medication Sig Start Date End Date Taking? Authorizing Provider  ?Ascorbic Acid (VITAMIN C PO) Take 1 tablet by mouth daily.   Yes [provider]  ?aspirin EC 81 MG tablet Take 1 tablet (81 mg total) by mouth 2 (two) times daily. To be taken after surgery to prevent blood clots 11/12/21    Aundra Dubin, PA-C  ?atorvastatin (LIPITOR) 20 MG tablet Take 20 mg by mouth daily.   Yes [provider]  ?cholecalciferol (VITAMIN D) 1000 UNITS tablet Take 1,000 Units by mouth daily.   Yes [provider]  ?docusate sodium (COLACE) 100 MG capsule Take 1 capsule (100 mg total) by mouth daily as needed. 11/12/21 11/12/22  Aundra Dubin, PA-C  ?lisinopril-hydrochlorothiazide (PRINZIDE,ZESTORETIC) 10-12.5 MG per tablet Take 1 tablet by mouth every morning.    Yes [provider]  ?methocarbamol (ROBAXIN) 500 MG tablet Take 1 tablet (500 mg total) by mouth 2 (two) times daily as needed. To be taken after surgery 11/12/21   Aundra Dubin, PA-C  ?Nebivolol HCl 20 MG TABS Take 20 mg by mouth at bedtime.   Yes [provider]  ?Omega-3 Fatty Acids (OMEGA-3 EPA FISH OIL PO) Take 1 capsule by mouth daily.   Yes [provider]  ?ondansetron (ZOFRAN) 4 MG tablet Take 1 tablet (4 mg total) by mouth every 8 (eight) hours as needed for nausea or vomiting. 11/12/21   Aundra Dubin, PA-C  ?oxyCODONE-acetaminophen (PERCOCET) 5-325 MG tablet Take 1-2 tablets by mouth every 6 (six) hours as needed. To be taken after surgery 11/12/21   Aundra Dubin, PA-C  ? ? ? ?  Positive ROS: All other systems have been reviewed and were otherwise negative with the exception of those mentioned in the HPI and as above. ? ?Physical Exam: ?General: Alert, no acute distress ?Cardiovascular: No pedal edema ?Respiratory: No cyanosis, no use of accessory musculature ?GI: abdomen soft ?Skin: No lesions in the area of chief complaint ?Neurologic: Sensation intact distally ?Psychiatric: Patient is competent for consent with normal mood and affect ?Lymphatic: no lymphedema ? ?MUSCULOSKELETAL: exam stable ? ?Assessment: ?right hip avascular necrosis ? ?Plan: ?Plan for Procedure(s): ?RIGHT TOTAL HIP ARTHROPLASTY ANTERIOR APPROACH ? ?The risks benefits and alternatives were discussed with the patient  including but not limited to the risks of nonoperative treatment, versus surgical intervention including infection, bleeding, nerve injury,  blood clots, cardiopulmonary complications, morbidity, mortality, among others, and they were willing to proceed.  ? ?Preoperative templating of the joint replacement has been completed, documented, and submitted to the Operating Room personnel in order to optimize intra-operative equipment management. ? ? ?Eduard Roux, MD ?11/19/2021 ?5:37 AM ? ?

## 2021-11-19 NOTE — Evaluation (Signed)
Physical Therapy Evaluation ?Patient Details ?Name: Anthony James ?MRN: [id number] ?DOB: [birth year] ?Today's Date: 11/19/2021 ? ?History of Present Illness ? Anthony James is a 56 y.o. male presenting 11/19/21 s/p R THA direct anterior approach. PMH includes A-fib, HTN, Dysrhythmia, lx laminectomy (2016).  ?Clinical Impression ? Pt presents s/p R THA direct anterior approach. Pt impairments include decreased safety awareness, knowledge of use of DME, and range of motion. These impairments are limiting his ability to safely and independently ambulate and navigate stairs. Pt mod I for bed mobility, independent with transfers, and min guard A for ambulation using RW. Pt stating that "this walker is slowing me down". Pt educated on exercise program and provided with handout to perform this afternoon/evening to increase circulation and modulate pain.  PT to progress mobility as tolerated, and will continue to follow acutely to maximize pt's safety and independence with functional mobility. ?  ?   ?   ? ?Recommendations for follow up therapy are one component of a multi-disciplinary discharge planning process, led by the attending physician.  Recommendations may be updated based on patient status, additional functional criteria and insurance authorization. ? ?Follow Up Recommendations Follow physician's recommendations for discharge plan and follow up therapies ? ?  ?Assistance Recommended at Discharge PRN  ?Patient can return home with the following ? Help with stairs or ramp for entrance;Assist for transportation ? ?  ?Equipment Recommendations Other (comment) (TBD pending pt progression)  ?Recommendations for Other Services ?    ?  ?Functional Status Assessment Patient has had a recent decline in their functional status and demonstrates the ability to make significant improvements in function in a reasonable and predictable amount of time.  ? ?  ?Precautions / Restrictions Precautions ?Precautions: Fall ?Restrictions ?Weight Bearing  Restrictions: Yes ?RLE Weight Bearing: Weight bearing as tolerated  ? ?  ? ?Mobility ? Bed Mobility ?Overal bed mobility: Modified Independent ?  ?  ?  ?  ?  ?  ?General bed mobility comments: required increased time to sit up at EOB. ?  ? ?Transfers ?Overall transfer level: Independent ?Equipment used: Rolling walker (2 wheels) ?  ?  ?  ?  ?  ?  ?  ?General transfer comment: was independent with sit to stand transfer. Pt powered up quickly with no unsteadiness noted. Pt did not use RW that was placed in front of him until in fully upright position ?  ? ?Ambulation/Gait ?Ambulation/Gait assistance: Min guard ?Gait Distance (Feet): 100 Feet ?Assistive device: Rolling walker (2 wheels) ?Gait Pattern/deviations: Step-through pattern, Decreased stride length ?Gait velocity: within functional limits ?  ?  ?General Gait Details: Min guard for safety and steadying since pt slightly impulsive during ambulation (i.e. turning quickly, picking up RW with turns). Pt did require cues for improved heel strike. Pt with 6/10 pain on surgical site. ? ?Stairs ?  ?  ?  ?  ?  ? ?Wheelchair Mobility ?  ? ?Modified Rankin (Stroke Patients Only) ?  ? ?  ? ?Balance Overall balance assessment: Needs assistance ?Sitting-balance support: No upper extremity supported, Feet supported ?Sitting balance-Leahy Scale: Good ?  ?  ?Standing balance support: Bilateral upper extremity supported, During functional activity ?Standing balance-Leahy Scale: Fair ?Standing balance comment: Required min guard A for safety and steadying during ambulation. pt used RW for ambulation, however, pt picking up RW off ground when turning without difficulty or LOB. Pt states "i feel like this walker is slowing me down". Pt not reliant on RW for support. ?  ?  ?  ?  ?  ?  ?  ?  ?  ?  ?  ?   ? ? ? ?  Pertinent Vitals/Pain Pain Assessment ?Pain Assessment: 0-10 ?Pain Score: 6  ?Pain Location: R THA surgical site ?Pain Descriptors / Indicators: Sharp, Discomfort ?Pain  Intervention(s): Monitored during session  ? ? ?Home Living Family/patient expects to be discharged to:: Private residence ?Living Arrangements: Alone ?Available Help at Discharge: Friend(s);Available 24 hours/day ?Type of Home: House ?Home Access: Stairs to enter ?Entrance Stairs-Rails: Right ?Entrance Stairs-Number of Steps: 11 ?Alternate Level Stairs-Number of Steps: flight ?Home Layout: Two level;Able to live on main level with bedroom/bathroom ?Home Equipment: Grab bars - tub/shower ?Additional Comments: Friend will be staying with him at the house to help him post D/C.  ?  ?Prior Function Prior Level of Function : Independent/Modified Independent;Driving;Working/employed ?  ?  ?  ?  ?  ?  ?Mobility Comments: indep. Works as a Stage manager. ?  ?  ? ? ?Hand Dominance  ?   ? ?  ?Extremity/Trunk Assessment  ? Upper Extremity Assessment ?Upper Extremity Assessment: Overall WFL for tasks assessed ?  ? ?Lower Extremity Assessment ?Lower Extremity Assessment: RLE deficits/detail ?RLE Deficits / Details: consistent with R THA direct anterior approach ?  ? ?Cervical / Trunk Assessment ?Cervical / Trunk Assessment: Normal  ?Communication  ? Communication: No difficulties  ?Cognition Arousal/Alertness: Awake/alert ?Behavior During Therapy: [facility] for tasks assessed/performed ?Overall Cognitive Status: Within Functional Limits for tasks assessed ?  ?  ?  ?  ?  ?  ?  ?  ?  ?  ?  ?  ?  ?  ?  ?  ?General Comments: pt slightly impulsive due to self motivated attitude. ?  ?  ? ?  ?General Comments   ? ?  ?Exercises General Exercises - Lower Extremity ?Ankle Circles/Pumps: Both, 20 reps, Seated ?Quad Sets: Right, 20 reps, Seated ?Heel Slides: Right, 10 reps, Seated ?Hip ABduction/ADduction: Right, 10 reps, Seated  ? ?Assessment/Plan  ?  ?PT Assessment Patient needs continued PT services  ?PT Problem List Decreased range of motion;Decreased knowledge of use of DME;Pain;Decreased strength;Decreased mobility ? ?   ?  ?PT  Treatment Interventions DME instruction;Gait training;Stair training;Functional mobility training;Therapeutic activities;Therapeutic exercise;Patient/family education;Balance training   ? ?PT Goals (Current goals can be found in the Care Plan section)  ?Acute Rehab PT Goals ?Patient Stated Goal: to return home ?PT Goal Formulation: With patient ?Time For Goal Achievement: 11/26/21 ?Potential to Achieve Goals: Good ? ?  ?Frequency 7X/week ?  ? ? ?Co-evaluation   ?  ?  ?  ?  ? ? ?  ?AM-PAC PT "6 Clicks" Mobility  ?Outcome Measure Help needed turning from your back to your side while in a flat bed without using bedrails?: None ?Help needed moving from lying on your back to sitting on the side of a flat bed without using bedrails?: None ?Help needed moving to and from a bed to a chair (including a wheelchair)?: None ?Help needed standing up from a chair using your arms (e.g., wheelchair or bedside chair)?: None ?Help needed to walk in hospital room?: A Little ?Help needed climbing 3-5 steps with a railing? : A Little ?6 Click Score: 22 ? ?  ?End of Session Equipment Utilized During Treatment: Gait belt ?Activity Tolerance: Patient tolerated treatment well ?Patient left: in chair;with call bell/phone within reach;with family/visitor present ?Nurse Communication: Mobility status ?PT Visit Diagnosis: Pain;Other abnormalities of gait and mobility (R26.89) ?Pain - Right/Left: Right ?Pain - part of body: Hip ?  ? ?Time: 1761-6073 ?PT Time Calculation (min) (ACUTE ONLY): 25 min ? ? ?  Charges:   PT Evaluation ?$PT Eval Low Complexity: 1 Low ?PT Treatments ?$Gait Training: 8-22 mins ?  ?   ? ? ?Enis SlipperIsabel Alden Bensinger, SPT ? ?Enis Slippersabel Sheilia Reznick ?11/19/2021, 2:50 PM ? ?

## 2021-11-20 ENCOUNTER — Encounter (HOSPITAL_COMMUNITY): Payer: Self-pay | Admitting: Orthopaedic Surgery

## 2021-11-20 ENCOUNTER — Telehealth: Payer: Self-pay

## 2021-11-20 DIAGNOSIS — M87051 Idiopathic aseptic necrosis of right femur: Secondary | ICD-10-CM | POA: Diagnosis not present

## 2021-11-20 LAB — CBC
HCT: 35.2 % — ABNORMAL LOW (ref 39.0–52.0)
Hemoglobin: 12.8 g/dL — ABNORMAL LOW (ref 13.0–17.0)
MCH: 34.8 pg — ABNORMAL HIGH (ref 26.0–34.0)
MCHC: 36.4 g/dL — ABNORMAL HIGH (ref 30.0–36.0)
MCV: 95.7 fL (ref 80.0–100.0)
Platelets: 217 10*3/uL (ref 150–400)
RBC: 3.68 MIL/uL — ABNORMAL LOW (ref 4.22–5.81)
RDW: 11.6 % (ref 11.5–15.5)
WBC: 16.1 10*3/uL — ABNORMAL HIGH (ref 4.0–10.5)
nRBC: 0 % (ref 0.0–0.2)

## 2021-11-20 NOTE — Discharge Summary (Signed)
Patient ID: ?Henreitta Leber ?MRN: [id number] ?DOB/AGE: Jul 01, 1966 56 y.o. ? ?Admit date: 11/19/2021 ?Discharge date: 11/20/2021 ? ?Admission Diagnoses:  ?Principal Problem: ?  Avascular necrosis of bone of right hip (HCC) ?Active Problems: ?  Avascular necrosis of bone of hip (HCC) ?  Status post total replacement of right hip ? ? ?Discharge Diagnoses:  ?Same ? ?Past Medical History:  ?Diagnosis Date  ? Atrial flutter (HCC)   ? 08/05/2013  ? Dysrhythmia   ? Hypertension   ? ? ?Surgeries: Procedure(s): ?RIGHT TOTAL HIP REPLACEMENT on 11/19/2021 ?  ?Consultants:  ? ?Discharged Condition: Improved ? ?Hospital Course: Deangelo Berns is an 56 y.o. male who was admitted 11/19/2021 for operative treatment ofAvascular necrosis of bone of right hip (HCC). Patient has severe unremitting pain that affects sleep, daily activities, and work/hobbies. After pre-op clearance the patient was taken to the operating room on 11/19/2021 and underwent  Procedure(s): ?RIGHT TOTAL HIP REPLACEMENT.   ? ?Patient was given perioperative antibiotics:  ?Anti-infectives (From admission, onward)  ? ? Start     Dose/Rate Route Frequency Ordered Stop  ? 11/19/21 1315  ceFAZolin (ANCEF) IVPB 2g/100 mL premix       ? 2 g ?200 mL/hr over 30 Minutes Intravenous Every 6 hours 11/19/21 1221 11/19/21 1857  ? 11/19/21 0757  vancomycin (VANCOCIN) powder  Status:  Discontinued       ?   As needed 11/19/21 0758 11/19/21 0901  ? 11/19/21 0600  ceFAZolin (ANCEF) IVPB 2g/100 mL premix       ? 2 g ?200 mL/hr over 30 Minutes Intravenous On call to O.R. 11/19/21 0540 11/19/21 0725  ? 11/19/21 0547  ceFAZolin (ANCEF) 2-4 GM/100ML-% IVPB       ?Note to Pharmacy: Lurena Nida: cabinet override  ?    11/19/21 0547 11/19/21 0725  ? ?  ?  ? ?Patient was given sequential compression devices, early ambulation, and chemoprophylaxis to prevent DVT. ? ?Patient benefited maximally from hospital stay and there were no complications.   ? ?Recent vital signs: Patient Vitals for the past 24 hrs: ?  BP Temp Temp src Pulse Resp SpO2  ?11/20/21 0740 117/86 98.5 ?F (36.9 ?C) Oral 65 19 100 %  ?11/20/21 0359 101/69 (!) 97.4 ?F (36.3 ?C) Oral 70 18 98 %  ?11/19/21 2329 -- -- -- (!) 102 -- 98 %  ?11/19/21 2326 107/71 (!) 97.5 ?F (36.4 ?C) Oral 96 18 90 %  ?11/19/21 1959 117/85 97.8 ?F (36.6 ?C) Oral (!) 104 18 100 %  ?11/19/21 1701 125/70 97.9 ?F (36.6 ?C) Oral (!) 52 18 98 %  ?11/19/21 1224 (!) 160/96 97.7 ?F (36.5 ?C) Oral (!) 52 18 100 %  ?11/19/21 1137 (!) 145/93 -- -- 63 19 96 %  ?11/19/21 1122 (!) 160/96 -- -- 77 15 99 %  ?11/19/21 1107 (!) 141/100 -- -- (!) 112 16 93 %  ?11/19/21 1052 (!) 142/115 -- -- 63 12 99 %  ?11/19/21 1037 (!) 150/105 -- -- 60 12 99 %  ?11/19/21 1022 (!) 145/97 -- -- 62 11 99 %  ?11/19/21 1007 (!) 155/94 -- -- (!) 45 13 100 %  ?11/19/21 0952 (!) 152/96 -- -- (!) 48 12 100 %  ?11/19/21 0937 (!) 124/92 -- -- (!) 53 19 100 %  ?11/19/21 0907 105/73 97.6 ?F (36.4 ?C) -- (!) 54 14 99 %  ?  ? ?Recent laboratory studies:  ?Recent Labs  ?  11/20/21 ?0616  ?WBC 16.1*  ?HGB 12.8*  ?  HCT 35.2*  ?PLT 217  ? ? ? ?Discharge Medications:   ?Allergies as of 11/20/2021   ?No Known Allergies ?  ? ?  ?Medication List  ?  ? ?STOP taking these medications   ? ?OMEGA-3 EPA FISH OIL PO ?  ? ?  ? ?TAKE these medications   ? ?aspirin EC 81 MG tablet ?Take 1 tablet (81 mg total) by mouth 2 (two) times daily. To be taken after surgery to prevent blood clots ?  ?atorvastatin 20 MG tablet ?Commonly known as: LIPITOR ?Take 20 mg by mouth daily. ?  ?cholecalciferol 1000 units tablet ?Commonly known as: VITAMIN D ?Take 1,000 Units by mouth daily. ?  ?docusate sodium 100 MG capsule ?Commonly known as: Colace ?Take 1 capsule (100 mg total) by mouth daily as needed. ?  ?lisinopril-hydrochlorothiazide 10-12.5 MG tablet ?Commonly known as: ZESTORETIC ?Take 1 tablet by mouth every morning. ?  ?methocarbamol 500 MG tablet ?Commonly known as: Robaxin ?Take 1 tablet (500 mg total) by mouth 2 (two) times daily as needed. To be taken  after surgery ?  ?Nebivolol HCl 20 MG Tabs ?Take 20 mg by mouth at bedtime. ?  ?ondansetron 4 MG tablet ?Commonly known as: Zofran ?Take 1 tablet (4 mg total) by mouth every 8 (eight) hours as needed for nausea or vomiting. ?  ?oxyCODONE-acetaminophen 5-325 MG tablet ?Commonly known as: Percocet ?Take 1-2 tablets by mouth every 6 (six) hours as needed. To be taken after surgery ?  ?VITAMIN C PO ?Take 1 tablet by mouth daily. ?  ? ?  ? ?  ?  ? ? ?  ?Durable Medical Equipment  ?(From admission, onward)  ?  ? ? ?  ? ?  Start     Ordered  ? 11/19/21 1222  DME Walker rolling  Once       ?Question:  Patient needs a walker to treat with the following condition  Answer:  History of hip replacement  ? 11/19/21 1221  ? 11/19/21 1222  DME 3 n 1  Once       ? 11/19/21 1221  ? 11/19/21 1222  DME Bedside commode  Once       ?Question:  Patient needs a bedside commode to treat with the following condition  Answer:  History of hip replacement  ? 11/19/21 1221  ? ?  ?  ? ?  ? ? ?Diagnostic Studies: DG Pelvis Portable ? ?Result Date: 11/19/2021 ?CLINICAL DATA:  Post RIGHT hip arthroplasty EXAM: PORTABLE PELVIS 1-2 VIEWS COMPARISON:  Portable exam 0916 hours compared to 11/19/2021 FINDINGS: RIGHT hip prosthesis in expected position. Osseous mineralization normal. No fracture dislocation identified on single AP view. IMPRESSION: RIGHT hip prosthesis without acute complication. Electronically Signed   By: Ulyses Southward M.D.   On: 11/19/2021 09:31  ? ?DG C-Arm 1-60 Min-No Report ? ?Result Date: 11/19/2021 ?Fluoroscopy was utilized by the requesting physician.  No radiographic interpretation.  ? ?DG HIP UNILAT WITH PELVIS 1V RIGHT ? ?Result Date: 11/19/2021 ?CLINICAL DATA:  Intraoperative imaging for right total hip replacement. EXAM: DG HIP (WITH OR WITHOUT PELVIS) 1V RIGHT COMPARISON:  Radiograph September 06, 2021. FINDINGS: 4 intraoperative spot images were submitted for postoperative evaluation, demonstrating surgical changes of right total  hip arthroplasty which appears to be in anatomic alignment without evidence of acute complication. Fluoroscopy time: 26 seconds.  Dose: 3.38 mGy IMPRESSION: Intraoperative imaging of right total hip arthroplasty. Electronically Signed   By: Maudry Mayhew M.D.   On: 11/19/2021  08:52   ? ?Disposition: Discharge disposition: 01-Home or Self Care ? ? ? ? ? ? ? ? ? Follow-up Information   ? ? Tarry KosXu, Naiping M, MD. Schedule an appointment as soon as possible for a visit in 2 week(s).   ?Specialty: Orthopedic Surgery ?Contact information: ?[address] ?[place] [address] ?[phone] ? ? ?  ?  ? ? [facility] Follow up.   ?Specialty: Home Health Services ?Contact information: ?[address] ?[address] ?[place] [address] ?[phone] ? ? ?  ?  ? ?  ?  ? ?  ? ? ? ?Signed: ?Cristie HemMary L Malary [place]worth ?11/20/2021, 8:00 AM ? ? ? ? ?

## 2021-11-20 NOTE — Progress Notes (Signed)
Physical Therapy Treatment ?Patient Details ?Name: Anthony James ?MRN: [id number] ?DOB: 01-11-1966 ?Today's Date: 11/20/2021 ? ? ?History of Present Illness Anthony James is a 56 y.o. male presenting 11/19/21 s/p R THA direct anterior approach. PMH includes A-fib, HTN, Dysrhythmia, lx laminectomy (2016). ? ?  ?PT Comments  ? ? Pt progressing well towards all goals. Pt able to amb without AD with no antalgia, however shorter step length and cautious. Pt given SPC and instructed on proper sequencing with cane in L UE, pt with good return demonstration. Pt able to complete stair negotiation necessary to enter home. Pt with good attitude and demo's excellent rehab potential. Acute PT to cont to follow. ?   ?Recommendations for follow up therapy are one component of a multi-disciplinary discharge planning process, led by the attending physician.  Recommendations may be updated based on patient status, additional functional criteria and insurance authorization. ? ?Follow Up Recommendations ? Follow physician's recommendations for discharge plan and follow up therapies ?  ?  ?Assistance Recommended at Discharge PRN  ?Patient can return home with the following Assist for transportation ?  ?Equipment Recommendations ? Other (comment) (instructed pt to purchase a cane on his own)  ?  ?Recommendations for Other Services   ? ? ?  ?Precautions / Restrictions Precautions ?Precautions: Fall ?Restrictions ?Weight Bearing Restrictions: Yes ?RLE Weight Bearing: Weight bearing as tolerated  ?  ? ?Mobility ? Bed Mobility ?Overal bed mobility: Modified Independent ?  ?  ?  ?  ?  ?  ?General bed mobility comments: educated on long sit and keeping R quad set when adducting R LE to EOB ?  ? ?Transfers ?Overall transfer level: Independent ?Equipment used: None ?  ?  ?  ?  ?  ?  ?  ?General transfer comment: pt able to stand without instability from bed without use of UEs or AD, pt also trialed sit to stand from lower surface height of the toliet to mimic  home and was able to do so without use of UEs or AD ?  ? ?Ambulation/Gait ?Ambulation/Gait assistance: Min guard ?Gait Distance (Feet): 200 Feet ?Assistive device: Straight cane, None ?Gait Pattern/deviations: Step-through pattern, Decreased stride length ?Gait velocity: wfl ?Gait velocity interpretation: >2.62 ft/sec, indicative of community ambulatory ?  ?General Gait Details: initally started with straight cane, pt educated on using cane in L UE and proper sequencing, pt with good return demonstration. Pt then transitioned to amb without cane. Pt steady with no antalgia on R LE, shorter step length compared to amb with cane however steady and with good mechanics, educated pt to get a cane for long distance/community ambulation to give support as pt is recovering for when fatigue sets in ? ? ?Stairs ?Stairs: Yes ?Stairs assistance: Min guard ?Stair Management: One rail Right, Alternating pattern, Forwards ?Number of Stairs: 12 (x2) ?General stair comments: pt educated on step to stair negotiation however was able to complete reciprocally without antalgia or difficulty ? ? ?Wheelchair Mobility ?  ? ?Modified Rankin (Stroke Patients Only) ?  ? ? ?  ?Balance Overall balance assessment: Mild deficits observed, not formally tested ?  ?  ?  ?  ?  ?  ?  ?  ?  ?  ?  ?  ?  ?  ?  ?  ?  ?  ?  ? ?  ?Cognition Arousal/Alertness: Awake/alert ?Behavior During Therapy: [facility] for tasks assessed/performed ?Overall Cognitive Status: Within Functional Limits for tasks assessed ?  ?  ?  ?  ?  ?  ?  ?  ?  ?  ?  ?  ?  ?  ?  ?  ?  General Comments: pt slightly impulsive due to self motivated attitude., but receptive to education and instruction from PT ?  ?  ? ?  ?Exercises General Exercises - Lower Extremity ?Heel Slides: Right, 10 reps, AAROM, Supine (knee towards chest) ?Hip ABduction/ADduction: Right, 10 reps, Seated ?Other Exercises ?Other Exercises: glut bridges in supine in the bed x10 reps ? ?  ?General Comments General comments  (skin integrity, edema, etc.): pt dressing intact, vss ?  ?  ? ?Pertinent Vitals/Pain Pain Assessment ?Pain Assessment: 0-10 ?Pain Score: 2  ?Pain Location: R THA surgical site ?Pain Descriptors / Indicators: Discomfort ?Pain Intervention(s): Monitored during session  ? ? ?Home Living   ?  ?  ?  ?  ?  ?  ?  ?  ?  ?   ?  ?Prior Function    ?  ?  ?   ? ?PT Goals (current goals can now be found in the care plan section) Acute Rehab PT Goals ?Patient Stated Goal: to return home ?PT Goal Formulation: With patient ?Time For Goal Achievement: 11/26/21 ?Potential to Achieve Goals: Good ?Progress towards PT goals: Progressing toward goals ? ?  ?Frequency ? ? ? 7X/week ? ? ? ?  ?PT Plan Current plan remains appropriate  ? ? ?Co-evaluation   ?  ?  ?  ?  ? ?  ?AM-PAC PT "6 Clicks" Mobility   ?Outcome Measure ? Help needed turning from your back to your side while in a flat bed without using bedrails?: None ?Help needed moving from lying on your back to sitting on the side of a flat bed without using bedrails?: None ?Help needed moving to and from a bed to a chair (including a wheelchair)?: None ?Help needed standing up from a chair using your arms (e.g., wheelchair or bedside chair)?: None ?Help needed to walk in hospital room?: A Little ?Help needed climbing 3-5 steps with a railing? : A Little ?6 Click Score: 22 ? ?  ?End of Session Equipment Utilized During Treatment: Gait belt ?Activity Tolerance: Patient tolerated treatment well ?Patient left: with call bell/phone within reach;in bed ?Nurse Communication: Mobility status ?PT Visit Diagnosis: Pain;Other abnormalities of gait and mobility (R26.89) ?Pain - Right/Left: Right ?Pain - part of body: Hip ?  ? ? ?Time: [phone] ?PT Time Calculation (min) (ACUTE ONLY): 23 min ? ?Charges:  $Gait Training: 8-22 mins ?$Therapeutic Exercise: 8-22 mins          ?          ? ?Kittie Plater, PT, DPT ?Acute Rehabilitation Services ?Secure chat preferred ?Office #: [phone] ? ? ? ?Refugio Mcconico M  Laya Letendre ?11/20/2021, 9:11 AM ? ?

## 2021-11-20 NOTE — Plan of Care (Signed)
Pt doing well. Pt given D/C instructions with verbal understanding. Rx's were sent to the pharmacy by MD. Pt's incision is clean and dry with no sign of infection. Pt's IV was removed prior to D/C. Pt D/C'd home via wheelchair per MD order. Pt is stable @ D/C and has no other needs at this time. Sharlisa Hollifield, RN  

## 2021-11-20 NOTE — Progress Notes (Addendum)
Subjective: ?1 Day Post-Op Procedure(s) (LRB): ?RIGHT TOTAL HIP REPLACEMENT (Right) ?Patient reports pain as mild.   ?Objective: ?Vital signs in last 24 hours: ?Temp:  [97.4 ?F (36.3 ?C)-98.5 ?F (36.9 ?C)] 98.5 ?F (36.9 ?C) (05/02 0740) ?Pulse Rate:  [45-112] 65 (05/02 0740) ?Resp:  [11-19] 19 (05/02 0740) ?BP: (101-160)/(69-115) 117/86 (05/02 0740) ?SpO2:  [90 %-100 %] 100 % (05/02 0740) ? ?Intake/Output from previous day: ?05/01 0701 - 05/02 0700 ?In: 2439 [P.O.:840; I.V.:1199; IV Piggyback:400] ?Out: 650 [Urine:500; Blood:150] ?Intake/Output this shift: ?No intake/output data recorded. ? ?Recent Labs  ?  11/20/21 ?0616  ?HGB 12.8*  ? ?Recent Labs  ?  11/20/21 ?0616  ?WBC 16.1*  ?RBC 3.68*  ?HCT 35.2*  ?PLT 217  ? ?No results for input(s): NA, K, CL, CO2, BUN, CREATININE, GLUCOSE, CALCIUM in the last 72 hours. ?No results for input(s): LABPT, INR in the last 72 hours. ? ?Neurologically intact ?Neurovascular intact ?Sensation intact distally ?Intact pulses distally ?Dorsiflexion/Plantar flexion intact ?Incision: dressing C/D/I ?No cellulitis present ?Compartment soft ? ? ?Assessment/Plan: ?1 Day Post-Op Procedure(s) (LRB): ?RIGHT TOTAL HIP REPLACEMENT (Right) ?Advance diet ?Up with therapy ?D/c home after first PT session ?WBAT RLE ?ABLA- mild and stable ? ? ? ? ? ? ?Cristie Hem ?11/20/2021, 7:58 AM ? ?

## 2021-11-20 NOTE — Telephone Encounter (Signed)
Dorene Sorrow called and states patient declined HHPT. FYI. ? ? ? ?

## 2021-11-20 NOTE — Telephone Encounter (Signed)
Ok thnx

## 2021-11-27 ENCOUNTER — Telehealth: Payer: Self-pay

## 2021-11-27 NOTE — Telephone Encounter (Signed)
Patient called needs note for [facility] [employer]. Please provide note stating patient had Right THA on 5/1 and will be oow approx. 12 weeks post op. Fax [employer] [phone]. Thanks ?

## 2021-11-28 NOTE — Telephone Encounter (Signed)
Ok for note 

## 2021-11-28 NOTE — Telephone Encounter (Signed)
Called and advised pt.

## 2021-11-28 NOTE — Telephone Encounter (Signed)
yes

## 2021-11-28 NOTE — Telephone Encounter (Signed)
Note completed and faxed

## 2021-12-04 ENCOUNTER — Ambulatory Visit (INDEPENDENT_AMBULATORY_CARE_PROVIDER_SITE_OTHER): Payer: BC Managed Care – PPO | Admitting: Orthopaedic Surgery

## 2021-12-04 DIAGNOSIS — Z96641 Presence of right artificial hip joint: Secondary | ICD-10-CM

## 2021-12-04 NOTE — Progress Notes (Signed)
? ?  Post-Op Visit Note ?  ?Patient: Anthony James           ?Date of Birth: Feb 10, 1966           ?MRN: [id number] ?Visit Date: 12/04/2021 ?PCP: Ralene Ok, MD ? ? ?Assessment & Plan: ? ?Chief Complaint:  ?Chief Complaint  ?Patient presents with  ? Right Hip - Routine Post Op  ? ?Visit Diagnoses:  ?1. Status post total replacement of right hip   ? ? ?Plan: Celeste is 2 weeks status post right total hip replacement on 11/19/2021.  Ambulate with cane at this time.  Doing well overall.  No real complaints other than some soreness and some swelling. ? ?Examination of right hip shows a healed surgical incision.  Mild swelling around the thigh.  No signs of infection.  No neurovascular compromise. ? ?Wound care instructions reviewed.  Implant card handicap placard provided.  Increase walking as tolerated.  Dental prophylaxis reinforced.  Recheck in 4 weeks with standing AP pelvis x-rays. ? ?Follow-Up Instructions: Return in about 4 weeks (around 01/01/2022).  ? ?Orders:  ?No orders of the defined types were placed in this encounter. ? ?No orders of the defined types were placed in this encounter. ? ? ?Imaging: ?No results found. ? ?PMFS History: ?Patient Active Problem List  ? Diagnosis Date Noted  ? Avascular necrosis of bone of hip (HCC) 11/19/2021  ? Status post total replacement of right hip 11/19/2021  ? Paroxysmal atrial fibrillation (HCC) 10/10/2021  ? Avascular necrosis of bone of right hip (HCC) 09/06/2021  ? Essential hypertension 06/05/2020  ? Typical atrial flutter (HCC) 06/05/2020  ? Spinal stenosis of lumbar region 09/07/2014  ? Spinal stenosis at L4-L5 level 09/07/2014  ? H/O atrial flutter 08/05/2013  ? ?Past Medical History:  ?Diagnosis Date  ? Atrial flutter (HCC)   ? 08/05/2013  ? Dysrhythmia   ? Hypertension   ?  ?No family history on file.  ?Past Surgical History:  ?Procedure Laterality Date  ? LUMBAR LAMINECTOMY/DECOMPRESSION MICRODISCECTOMY N/A 09/07/2014  ? Procedure: MICRO LUMBAR DECOMPRESSION L4-5;   Surgeon: Javier Docker, MD;  Location: [facility];  Service: Orthopedics;  Laterality: N/A;  ? surgery for hole in retinas    ? 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 TOTAL HIP ARTHROPLASTY Right 11/19/2021  ? Procedure: RIGHT TOTAL HIP REPLACEMENT;  Surgeon: Tarry Kos, MD;  Location: [facility];  Service: Orthopedics;  Laterality: Right;  ? WISDOM TOOTH EXTRACTION    ? ?Social History  ? ?Occupational History  ? Not on file  ?Tobacco Use  ? Smoking status: Never  ? Smokeless tobacco: Never  ?Vaping Use  ? Vaping Use: Never used  ?Substance and Sexual Activity  ? Alcohol use: Yes  ?  Alcohol/week: 3.0 - 4.0 standard drinks  ?  Types: 3 - 4 Cans of beer per week  ? Drug use: No  ? Sexual activity: Not on file  ? ? ? ?

## 2022-01-08 ENCOUNTER — Encounter: Payer: Self-pay | Admitting: Orthopaedic Surgery

## 2022-01-08 ENCOUNTER — Ambulatory Visit (INDEPENDENT_AMBULATORY_CARE_PROVIDER_SITE_OTHER): Payer: BC Managed Care – PPO | Admitting: Orthopaedic Surgery

## 2022-01-08 ENCOUNTER — Ambulatory Visit (INDEPENDENT_AMBULATORY_CARE_PROVIDER_SITE_OTHER): Payer: BC Managed Care – PPO

## 2022-01-08 DIAGNOSIS — Z96641 Presence of right artificial hip joint: Secondary | ICD-10-CM

## 2022-01-08 NOTE — Progress Notes (Signed)
   Post-Op Visit Note   Patient: Anthony James           Date of Birth: [birth year]           MRN: [id number] Visit Date: 01/08/2022 PCP: Ralene Ok, MD   Assessment & Plan:  Chief Complaint:  Chief Complaint  Patient presents with   Right Hip - Follow-up    Right total hip arthroplasty 11/19/2021   Visit Diagnoses:  1. Status post total replacement of right hip     Plan: Anthony James is 6 weeks status post right total hip replacement on 11/19/2021 for avascular necrosis.  Feels some stiffness with hip flexion when putting on his socks but otherwise has no real complaints.  Examination of right hip shows decent range of motion without pain.  Surgical scars fully healed.  No signs of infection.  X-rays demonstrate stable right total hip replacement without any complications.  Anthony James is recovering well from his surgery and on track.  He will remain out of work until later next month.  He will continue to work on rehabbing his hip.  Dental prophylaxis and activity restrictions reviewed.  Recheck in 6 weeks.  Follow-Up Instructions: Return in about 6 weeks (around 02/19/2022).   Orders:  Orders Placed This Encounter  Procedures   XR Pelvis 1-2 Views   No orders of the defined types were placed in this encounter.   Imaging: XR Pelvis 1-2 Views  Result Date: 01/08/2022 Stable total hip replacement without complications.  Mild amount of atrial formation in the abductors.  Left femoral head appears stable without any collapse.   PMFS History: Patient Active Problem List   Diagnosis Date Noted   Avascular necrosis of bone of hip (HCC) 11/19/2021   Status post total replacement of right hip 11/19/2021   Paroxysmal atrial fibrillation (HCC) 10/10/2021   Avascular necrosis of bone of right hip (HCC) 09/06/2021   Essential hypertension 06/05/2020   Typical atrial flutter (HCC) 06/05/2020   Spinal stenosis of lumbar region 09/07/2014   Spinal stenosis at L4-L5 level 09/07/2014   H/O  atrial flutter 08/05/2013   Past Medical History:  Diagnosis Date   Atrial flutter (HCC)    08/05/2013   Dysrhythmia    Hypertension     No family history on file.  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

## 2022-02-19 ENCOUNTER — Ambulatory Visit (INDEPENDENT_AMBULATORY_CARE_PROVIDER_SITE_OTHER): Payer: BC Managed Care – PPO | Admitting: Orthopaedic Surgery

## 2022-02-19 ENCOUNTER — Telehealth: Payer: Self-pay | Admitting: Orthopaedic Surgery

## 2022-02-19 DIAGNOSIS — Z96641 Presence of right artificial hip joint: Secondary | ICD-10-CM

## 2022-02-19 NOTE — Telephone Encounter (Signed)
Work note needed during todays visit

## 2022-02-19 NOTE — Progress Notes (Signed)
   Post-Op Visit Note   Patient: Anthony James           Date of Birth: May 10, 1966           MRN: [id number] Visit Date: 02/19/2022 PCP: Ralene Ok, MD   Assessment & Plan:  Chief Complaint:  Chief Complaint  Patient presents with   Right Hip - Routine Post Op   Visit Diagnoses:  1. Status post total replacement of right hip     Plan: Anthony James is 3 months status post right total hip on 11/19/2021.  He is ready to go back to work at [employer].  No complaints.  Patient has done very well so far.  He has no complaints.  Surgical scar is fully healed.  Released to work note provided today.  Recheck in 3 months with standing AP pelvis x-rays.  Dental prophylaxis reinforced.  Follow-Up Instructions: Return in about 3 months (around 05/22/2022).   Orders:  No orders of the defined types were placed in this encounter.  No orders of the defined types were placed in this encounter.   Imaging: No results found.  PMFS History: Patient Active Problem List   Diagnosis Date Noted   Avascular necrosis of bone of hip (HCC) 11/19/2021   Status post total replacement of right hip 11/19/2021   Paroxysmal atrial fibrillation (HCC) 10/10/2021   Avascular necrosis of bone of right hip (HCC) 09/06/2021   Essential hypertension 06/05/2020   Typical atrial flutter (HCC) 06/05/2020   Spinal stenosis of lumbar region 09/07/2014   Spinal stenosis at L4-L5 level 09/07/2014   H/O atrial flutter 08/05/2013   Past Medical History:  Diagnosis Date   Atrial flutter (HCC)    08/05/2013   Dysrhythmia    Hypertension     No family history on file.  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

## 2022-04-16 ENCOUNTER — Ambulatory Visit: Payer: BC Managed Care – PPO | Admitting: Cardiology

## 2022-04-16 ENCOUNTER — Encounter: Payer: Self-pay | Admitting: Cardiology

## 2022-04-16 VITALS — BP 146/95 | HR 58 | Temp 97.2°F | Resp 16 | Ht 70.0 in | Wt 170.2 lb

## 2022-04-16 DIAGNOSIS — I10 Essential (primary) hypertension: Secondary | ICD-10-CM

## 2022-04-16 DIAGNOSIS — I480 Paroxysmal atrial fibrillation: Secondary | ICD-10-CM

## 2022-04-16 NOTE — Progress Notes (Signed)
Patient referred by [name], MD for atrial flutter  Subjective:   Anthony James, male    DOB: 1966-02-13, 56 y.o.   MRN: [id number]   Chief Complaint  Patient presents with   PAF    HPI  56 y.o. African American male with hypertension, A-fib/flutter  Patient is doing well, denies chest pain, shortness of breath, palpitations, leg edema, orthopnea, PND, TIA/syncope.  He is walking 2-4 miles without any difficulty. He does endorse snoring.   Initial consultation HPI: Patient had typical atrial flutter in 2015, that spontaneously converted to sinus rhythm while in the ED. There was a mention of ASD?PFO in the past, but echocardiogram in 2016 did not show either.  Patient is now referred back to me due to atrial flutter. 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 He denies chest pain, shortness of breath, palpitations, leg edema, orthopnea, PND, TIA/syncope.Blood pressure is elevated today, but is usually well controlled. He does endorse occasional snoring.    Current Outpatient Medications:    Ascorbic Acid (VITAMIN C PO), Take 1 tablet by mouth daily., Disp: , Rfl:    aspirin EC 81 MG tablet, Take 1 tablet (81 mg total) by mouth 2 (two) times daily. To be taken after surgery to prevent blood clots, Disp: 84 tablet, Rfl: 0   atorvastatin (LIPITOR) 20 MG tablet, Take 20 mg by mouth daily., Disp: , Rfl:    cholecalciferol (VITAMIN D) 1000 UNITS tablet, Take 1,000 Units by mouth daily., Disp: , Rfl:    docusate sodium (COLACE) 100 MG capsule, Take 1 capsule (100 mg total) by mouth daily as needed., Disp: 30 capsule, Rfl: 2   lisinopril-hydrochlorothiazide (PRINZIDE,ZESTORETIC) 10-12.5 MG per tablet, Take 1 tablet by mouth every morning. , Disp: , Rfl:    methocarbamol (ROBAXIN) 500 MG tablet, Take 1 tablet (500 mg total) by mouth 2 (two) times daily as needed. To be taken after surgery, Disp: 20 tablet, Rfl: 2   Nebivolol HCl 20 MG TABS, Take 20 mg by mouth at bedtime.,  Disp: , Rfl:    ondansetron (ZOFRAN) 4 MG tablet, Take 1 tablet (4 mg total) by mouth every 8 (eight) hours as needed for nausea or vomiting., Disp: 40 tablet, Rfl: 0   oxyCODONE-acetaminophen (PERCOCET) 5-325 MG tablet, Take 1-2 tablets by mouth every 6 (six) hours as needed. To be taken after surgery, Disp: 40 tablet, Rfl: 0    Cardiovascular and other pertinent studies:  EKG 04/16/2022: Sinus rhythm 58 bpm Left atrial enlargement Occasional PVC LVH Anterolateral T wave inversion, consider iscehmia  Echocardiogram 10/17/2021:  Normal LV systolic function with visual EF 60-65%. Left ventricle cavity  is normal in size. Mild left ventricular hypertrophy. Normal global wall  motion. Normal diastolic filling pattern, normal LAP.  Mild to moderate tricuspid regurgitation. No evidence of pulmonary  hypertension.  Compared to study 08/28/2020 G1DD is now normal otherwise no significant  change.   EKG 10/10/2021: Coarse atrial fibrillation 66 bpm  Recent labs: 11/07/2021: Glucose 139, BUN/Cr 9/1.13. EGFR >60. Na/K 139/4.1. AST/ALT 69/45. T.bili 1.6. Rest of the CMP normal H/H 12.8/35.2. MCV 95. Platelets 217  05/15/2020: Glucose 103, BUN/Cr 12/0.94. EGFR 1107. Na/K 140/4.6. Rest of the CMP normal H/H 13.5/41.3. MCV 101.7. Platelets 290 Chol 158, TG 141, HDL 70, LDL 60 TSH 1.22 normal   Review of Systems  Cardiovascular:  Negative for chest pain, dyspnea on exertion, leg swelling, palpitations and syncope.        Vitals:  04/16/22 1522 04/16/22 1523  BP: (!) 150/97 (!) 146/95  Pulse: (!) 58   Resp: 16   Temp: (!) 97.2 F (36.2 C)   SpO2: 100%      Body mass index is 24.42 kg/m. Filed Weights   04/16/22 1522  Weight: 170 lb 3.2 oz (77.2 kg)     Objective:   Physical Exam Vitals and nursing note reviewed.  Constitutional:      General: He is not in acute distress. Neck:     Vascular: No JVD.  Cardiovascular:     Rate and Rhythm: Normal rate and regular  rhythm.     Heart sounds: Normal heart sounds. No murmur heard. Pulmonary:     Effort: Pulmonary effort is normal.     Breath sounds: Normal breath sounds. No wheezing or rales.       ICD-10-CM   1. Paroxysmal atrial fibrillation (HCC)  I48.0 EKG 12-Lead    2. Essential hypertension  I10      Orders Placed This Encounter  Procedures   EKG 12-Lead         Assessment & Recommendations:    56 y.o. African American male with hypertension, A-fib/flutter  A-fib/flutter: In sinus rhythm today. Structurally normal ehart. Low suspicion for ischemia. Possible OSA, but he wants to hold off sleep study.  Suspect component of white coat hypertension, as blood pressure is well controlled otherwise. No change made today. Keep log of BP readings. If SBP consistently >140 mmHg, add amlodipine 5 mg daily. CHA2DS2VASc score 1, annual stroke risk 0.6%. Okay to continue Aspirin 81 mg for now.  Essential hypertension As above  F/u in 6 months    Nigel Mormon, MD Pager: [phone] Office: [phone]

## 2022-04-17 ENCOUNTER — Ambulatory Visit: Payer: BC Managed Care – PPO | Admitting: Cardiology

## 2022-05-14 ENCOUNTER — Ambulatory Visit: Payer: BC Managed Care – PPO | Admitting: Orthopaedic Surgery

## 2022-06-04 ENCOUNTER — Ambulatory Visit: Payer: BC Managed Care – PPO | Admitting: Orthopaedic Surgery

## 2022-06-06 ENCOUNTER — Ambulatory Visit (INDEPENDENT_AMBULATORY_CARE_PROVIDER_SITE_OTHER): Payer: BC Managed Care – PPO | Admitting: Orthopaedic Surgery

## 2022-06-06 ENCOUNTER — Ambulatory Visit (INDEPENDENT_AMBULATORY_CARE_PROVIDER_SITE_OTHER): Payer: BC Managed Care – PPO

## 2022-06-06 ENCOUNTER — Encounter: Payer: Self-pay | Admitting: Orthopaedic Surgery

## 2022-06-06 DIAGNOSIS — Z96641 Presence of right artificial hip joint: Secondary | ICD-10-CM

## 2022-06-06 NOTE — Progress Notes (Signed)
   Post-Op Visit Note   Patient: Anthony James           Date of Birth: 26-Dec-1965           MRN: [id number] Visit Date: 06/06/2022 PCP: Ralene Ok, MD   Assessment & Plan:  Chief Complaint:  Chief Complaint  Patient presents with   Right Hip - Follow-up    Right total hip arthroplasty 11/19/2021   Visit Diagnoses:  1. Status post total replacement of right hip     Plan: Patient is a pleasant 56 year old gentleman who comes in today 6 months status post right total hip replacement 11/19/2021.  He has been doing well.  No complaints of pain.  He is back to full activity.  Examination of his right hip reveals painless logroll and hip flexion.  He is neurovascular intact distally.  At this point, he will continue to increase activity as tolerated.  Dental prophylaxis reinforced.  Follow-up in 6 months for repeat evaluation and AP pelvis x-rays.  Call with concerns or questions.  Follow-Up Instructions: Return in about 6 months (around 12/05/2022).   Orders:  Orders Placed This Encounter  Procedures   XR Pelvis 1-2 Views   No orders of the defined types were placed in this encounter.   Imaging: XR Pelvis 1-2 Views  Result Date: 06/06/2022 X-rays demonstrate a well seated prosthesis without complication.  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

## 2022-10-16 ENCOUNTER — Ambulatory Visit: Payer: BC Managed Care – PPO | Admitting: Cardiology

## 2022-12-12 ENCOUNTER — Encounter: Payer: Self-pay | Admitting: Cardiology

## 2022-12-12 ENCOUNTER — Ambulatory Visit (INDEPENDENT_AMBULATORY_CARE_PROVIDER_SITE_OTHER): Payer: BC Managed Care – PPO

## 2022-12-12 ENCOUNTER — Ambulatory Visit: Payer: BC Managed Care – PPO | Admitting: Cardiology

## 2022-12-12 ENCOUNTER — Ambulatory Visit (INDEPENDENT_AMBULATORY_CARE_PROVIDER_SITE_OTHER): Payer: BC Managed Care – PPO | Admitting: Orthopaedic Surgery

## 2022-12-12 VITALS — BP 117/88 | HR 63 | Ht 70.0 in | Wt 162.0 lb

## 2022-12-12 DIAGNOSIS — Z96641 Presence of right artificial hip joint: Secondary | ICD-10-CM | POA: Diagnosis not present

## 2022-12-12 DIAGNOSIS — I480 Paroxysmal atrial fibrillation: Secondary | ICD-10-CM

## 2022-12-12 DIAGNOSIS — I10 Essential (primary) hypertension: Secondary | ICD-10-CM

## 2022-12-12 NOTE — Progress Notes (Signed)
Office Visit Note   Patient: Anthony James           Date of Birth: 09-07-1965           MRN: [id number] Visit Date: 12/12/2022              Requested by: Ralene Ok, MD [id number] [place],  [place] [address] PCP: Ralene Ok, MD   Assessment & Plan: Visit Diagnoses:  1. Status post total replacement of right hip     Plan: Impression is 57 year old gentleman 1 year status post right total hip replacement.  He has done very well and has been very satisfied with outcome.  Dental prophylaxis reinforced.  Recheck in another year with standing AP pelvis x-rays.  Follow-Up Instructions: Return in about 1 year (around 12/12/2023).   Orders:  Orders Placed This Encounter  Procedures   XR Pelvis 1-2 Views   No orders of the defined types were placed in this encounter.     Procedures: No procedures performed   Clinical Data: No additional findings.   Subjective: Chief Complaint  Patient presents with   Right Hip - Follow-up    Right total hip arthroplasty 11/19/2021    HPI Anthony James is following up today for his 1 year postop visit status post right total hip replacement.  He is doing well has no complaints. Review of Systems   Objective: Vital Signs: There were no vitals taken for this visit.  Physical Exam  Ortho Exam Examination right hip shows fully healed surgical scar.  Fluid painless range of motion.  Normal gait pattern. Specialty Comments:  No specialty comments available.  Imaging: XR Pelvis 1-2 Views  Result Date: 12/12/2022 Stable right total hip replacement without complications.  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    PMFS History: Patient Active Problem List   Diagnosis Date Noted   Avascular necrosis of bone of hip (HCC) 11/19/2021   Status post total replacement of right hip 11/19/2021   Paroxysmal atrial fibrillation (HCC) 10/10/2021   Avascular necrosis of bone of right hip (HCC) 09/06/2021   Essential  hypertension 06/05/2020   Typical atrial flutter (HCC) 06/05/2020   Spinal stenosis of lumbar region 09/07/2014   Spinal stenosis at L4-L5 level 09/07/2014   H/O atrial flutter 08/05/2013   Past Medical History:  Diagnosis Date   Atrial flutter (HCC)    08/05/2013   Dysrhythmia    Hypertension     No family history on file.  Past Surgical History:  Procedure Laterality Date   LUMBAR LAMINECTOMY/DECOMPRESSION MICRODISCECTOMY N/A 09/07/2014   Procedure: MICRO LUMBAR DECOMPRESSION L4-5;  Surgeon: Javier Docker, MD;  Location: [facility];  Service: Orthopedics;  Laterality: N/A;   surgery for hole in retinas     pt states "holes were sealed up" in retinas of both eyes   TOTAL HIP ARTHROPLASTY Right 11/19/2021   Procedure: RIGHT TOTAL HIP REPLACEMENT;  Surgeon: Tarry Kos, MD;  Location: [facility];  Service: Orthopedics;  Laterality: Right;   WISDOM TOOTH EXTRACTION     Social History   Occupational History   Not on file  Tobacco Use   Smoking status: Never   Smokeless tobacco: Never  Vaping Use   Vaping Use: Never used  Substance and Sexual Activity   Alcohol use: Yes    Alcohol/week: 3.0 - 4.0 standard drinks of alcohol    Types: 3 - 4 Cans of beer per week   Drug use: No   Sexual  activity: Not on file

## 2022-12-12 NOTE — Progress Notes (Signed)
Patient referred by Ralene Ok, MD for atrial flutter  Subjective:   Anthony James, male    DOB: 1965-11-21, 57 y.o.   MRN: [id number]   Chief Complaint  Patient presents with   Atrial Fibrillation   Follow-up    HPI  57 y.o. African American male with hypertension, A-fib/flutter  Patient is doing well. He denies chest pain, shortness of breath, palpitations, leg edema, orthopnea, PND, TIA/syncope.  Initial consultation HPI: Patient had typical atrial flutter in 2015, that spontaneously converted to sinus rhythm while in the ED. There was a mention of ASD?PFO in the past, but echocardiogram in 2016 did not show either.  Patient is now referred back to me due to atrial flutter. 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 He denies chest pain, shortness of breath, palpitations, leg edema, orthopnea, PND, TIA/syncope.Blood pressure is elevated today, but is usually well controlled. He does endorse occasional snoring.    Current Outpatient Medications:    Ascorbic Acid (VITAMIN C PO), Take 1 tablet by mouth daily., Disp: , Rfl:    aspirin EC 81 MG tablet, Take 1 tablet (81 mg total) by mouth 2 (two) times daily. To be taken after surgery to prevent blood clots, Disp: 84 tablet, Rfl: 0   atorvastatin (LIPITOR) 20 MG tablet, Take 20 mg by mouth daily., Disp: , Rfl:    cholecalciferol (VITAMIN D) 1000 UNITS tablet, Take 1,000 Units by mouth daily., Disp: , Rfl:    lisinopril-hydrochlorothiazide (PRINZIDE,ZESTORETIC) 10-12.5 MG per tablet, Take 1 tablet by mouth every morning. , Disp: , Rfl:    Nebivolol HCl 20 MG TABS, Take 20 mg by mouth at bedtime., Disp: , Rfl:    Omega-3 Fatty Acids (FISH OIL) 1000 MG CAPS, Take by mouth., Disp: , Rfl:     Cardiovascular and other pertinent studies:  EKG 12/12/2022: Sinus rhythm 63 bpm Left atrial enlargement. LVH Anterolateral T wave inversion, consider iscehmia (Uncahnged compared to previous EKG)   Echocardiogram 10/17/2021:   Normal LV systolic function with visual EF 60-65%. Left ventricle cavity  is normal in size. Mild left ventricular hypertrophy. Normal global wall  motion. Normal diastolic filling pattern, normal LAP.  Mild to moderate tricuspid regurgitation. No evidence of pulmonary  hypertension.  Compared to study 08/28/2020 G1DD is now normal otherwise no significant  change.   EKG 10/10/2021: Coarse atrial fibrillation 66 bpm  Recent labs: 11/07/2021: Glucose 139, BUN/Cr 9/1.13. EGFR >60. Na/K 139/4.1. AST/ALT 69/45. T.bili 1.6. Rest of the CMP normal H/H 12.8/35.2. MCV 95. Platelets 217  05/15/2020: Glucose 103, BUN/Cr 12/0.94. EGFR 1107. Na/K 140/4.6. Rest of the CMP normal H/H 13.5/41.3. MCV 101.7. Platelets 290 Chol 158, TG 141, HDL 70, LDL 60 TSH 1.22 normal   Review of Systems  Cardiovascular:  Negative for chest pain, dyspnea on exertion, leg swelling, palpitations and syncope.        There were no vitals filed for this visit.    There is no height or weight on file to calculate BMI. There were no vitals filed for this visit.    Objective:   Physical Exam Vitals and nursing note reviewed.  Constitutional:      General: He is not in acute distress. Neck:     Vascular: No JVD.  Cardiovascular:     Rate and Rhythm: Normal rate and regular rhythm.     Heart sounds: Normal heart sounds. No murmur heard. Pulmonary:     Effort: Pulmonary effort is normal.  Breath sounds: Normal breath sounds. No wheezing or rales.  Musculoskeletal:     Right lower leg: No edema.     Left lower leg: No edema.       ICD-10-CM   1. Paroxysmal atrial fibrillation (HCC)  I48.0 EKG 12-Lead     Orders Placed This Encounter  Procedures   EKG 12-Lead         Assessment & Recommendations:    57 y.o. African American male with hypertension, A-fib/flutter  A-fib/flutter: Maintaining sinus rhythm today. Structurally normal ehart. Unchanged EKG abnormality without any chest  pain. CHA2DS2VASc score 1, annual stroke risk 0.6%. Okay to omit anticoagulation. No absolute indication for Aspirin.  Essential hypertension Controlled.  F/u in 1 year    Elder Negus, MD Pager: [phone] Office: [phone]

## 2023-02-18 ENCOUNTER — Encounter: Payer: Self-pay | Admitting: Cardiology

## 2023-03-26 ENCOUNTER — Encounter: Payer: Self-pay | Admitting: Cardiology

## 2023-11-18 ENCOUNTER — Other Ambulatory Visit: Payer: Self-pay | Admitting: Internal Medicine

## 2023-11-18 DIAGNOSIS — R7989 Other specified abnormal findings of blood chemistry: Secondary | ICD-10-CM

## 2023-11-24 ENCOUNTER — Ambulatory Visit
Admission: RE | Admit: 2023-11-24 | Discharge: 2023-11-24 | Disposition: A | Discharge status: Home / Self Care | Source: Ambulatory Visit | Attending: Internal Medicine | Admitting: Internal Medicine

## 2023-11-24 DIAGNOSIS — R7989 Other specified abnormal findings of blood chemistry: Secondary | ICD-10-CM

## 2023-12-08 NOTE — Progress Notes (Signed)
   Post-Op Visit Note   Patient: Anthony James           Date of Birth: 12/28/1965           MRN: [id number] Visit Date: 12/09/2023 PCP: Edda Goo, MD   Assessment & Plan:  Chief Complaint:  Chief Complaint  Patient presents with   Right Hip - Follow-up   Visit Diagnoses:  1. Status post total replacement of right hip     Plan: History of Present Illness Anthony James is a 58 year old male who presents for a two-year follow-up after right hip replacement surgery.  He is satisfied with the surgical outcome and has resumed all desired activities without restrictions. He experiences no pain, dislocation, or complications related to the hip.  Physical Exam MUSCULOSKELETAL: Right hip scar is well-healed.  Fluid painless motion of the hip.  Results RADIOLOGY Hip X-ray: Well-positioned hip replacement with no signs of loosening (12/09/2023)  Assessment and Plan Right hip replacement Two years post-surgery, implant well-positioned, no pain or limitations. Discussed lifetime dislocation risk and advised caution with activities that may torque the hip. No antibiotic prophylaxis needed for dental procedures. - Advise follow-up if issues arise with hips. - Advise against activities that torque the hip, such as certain yoga positions.  Follow-Up Instructions: No follow-ups on file.   Orders:  Orders Placed This Encounter  Procedures   XR Pelvis 1-2 Views   No orders of the defined types were placed in this encounter.   Imaging: XR Pelvis 1-2 Views Result Date: 12/09/2023 X-rays of the pelvis show a stable right total hip replacement without complications.     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  Past Surgical History:  Procedure Laterality Date   LUMBAR LAMINECTOMY/DECOMPRESSION MICRODISCECTOMY N/A 09/07/2014   Procedure: MICRO LUMBAR DECOMPRESSION L4-5;  Surgeon: Loel Ring, MD;  Location: [facility];  Service: Orthopedics;  Laterality: N/A;   surgery for hole in retinas     pt states "holes were sealed up" in retinas of both eyes   TOTAL HIP ARTHROPLASTY Right 11/19/2021   Procedure: RIGHT TOTAL HIP REPLACEMENT;  Surgeon: Wes Hamman, MD;  Location: [facility];  Service: Orthopedics;  Laterality: Right;   WISDOM TOOTH EXTRACTION     Social History   Occupational History   Not on file  Tobacco Use   Smoking status: Never   Smokeless tobacco: Never  Vaping Use   Vaping status: Never Used  Substance and Sexual Activity   Alcohol use: Yes    Alcohol/week: 3.0 - 4.0 standard drinks of alcohol    Types: 3 - 4 Cans of beer per week   Drug use: No   Sexual activity: Not on file

## 2023-12-09 ENCOUNTER — Ambulatory Visit: Admitting: Orthopaedic Surgery

## 2023-12-09 ENCOUNTER — Other Ambulatory Visit (INDEPENDENT_AMBULATORY_CARE_PROVIDER_SITE_OTHER)

## 2023-12-09 DIAGNOSIS — Z96641 Presence of right artificial hip joint: Secondary | ICD-10-CM

## 2023-12-12 ENCOUNTER — Ambulatory Visit: Payer: Self-pay | Admitting: Cardiology

## 2023-12-12 ENCOUNTER — Ambulatory Visit: Payer: BC Managed Care – PPO | Admitting: Orthopaedic Surgery

## 2023-12-25 IMAGING — DX DG PORTABLE PELVIS
1 series · 1 of 1 positions shown · non-contrast
Comparison: Portable exam 8151 hours compared to 11/19/2021

CLINICAL DATA: Post RIGHT hip arthroplasty

EXAM:
PORTABLE PELVIS 1-2 VIEWS

[pelvis]
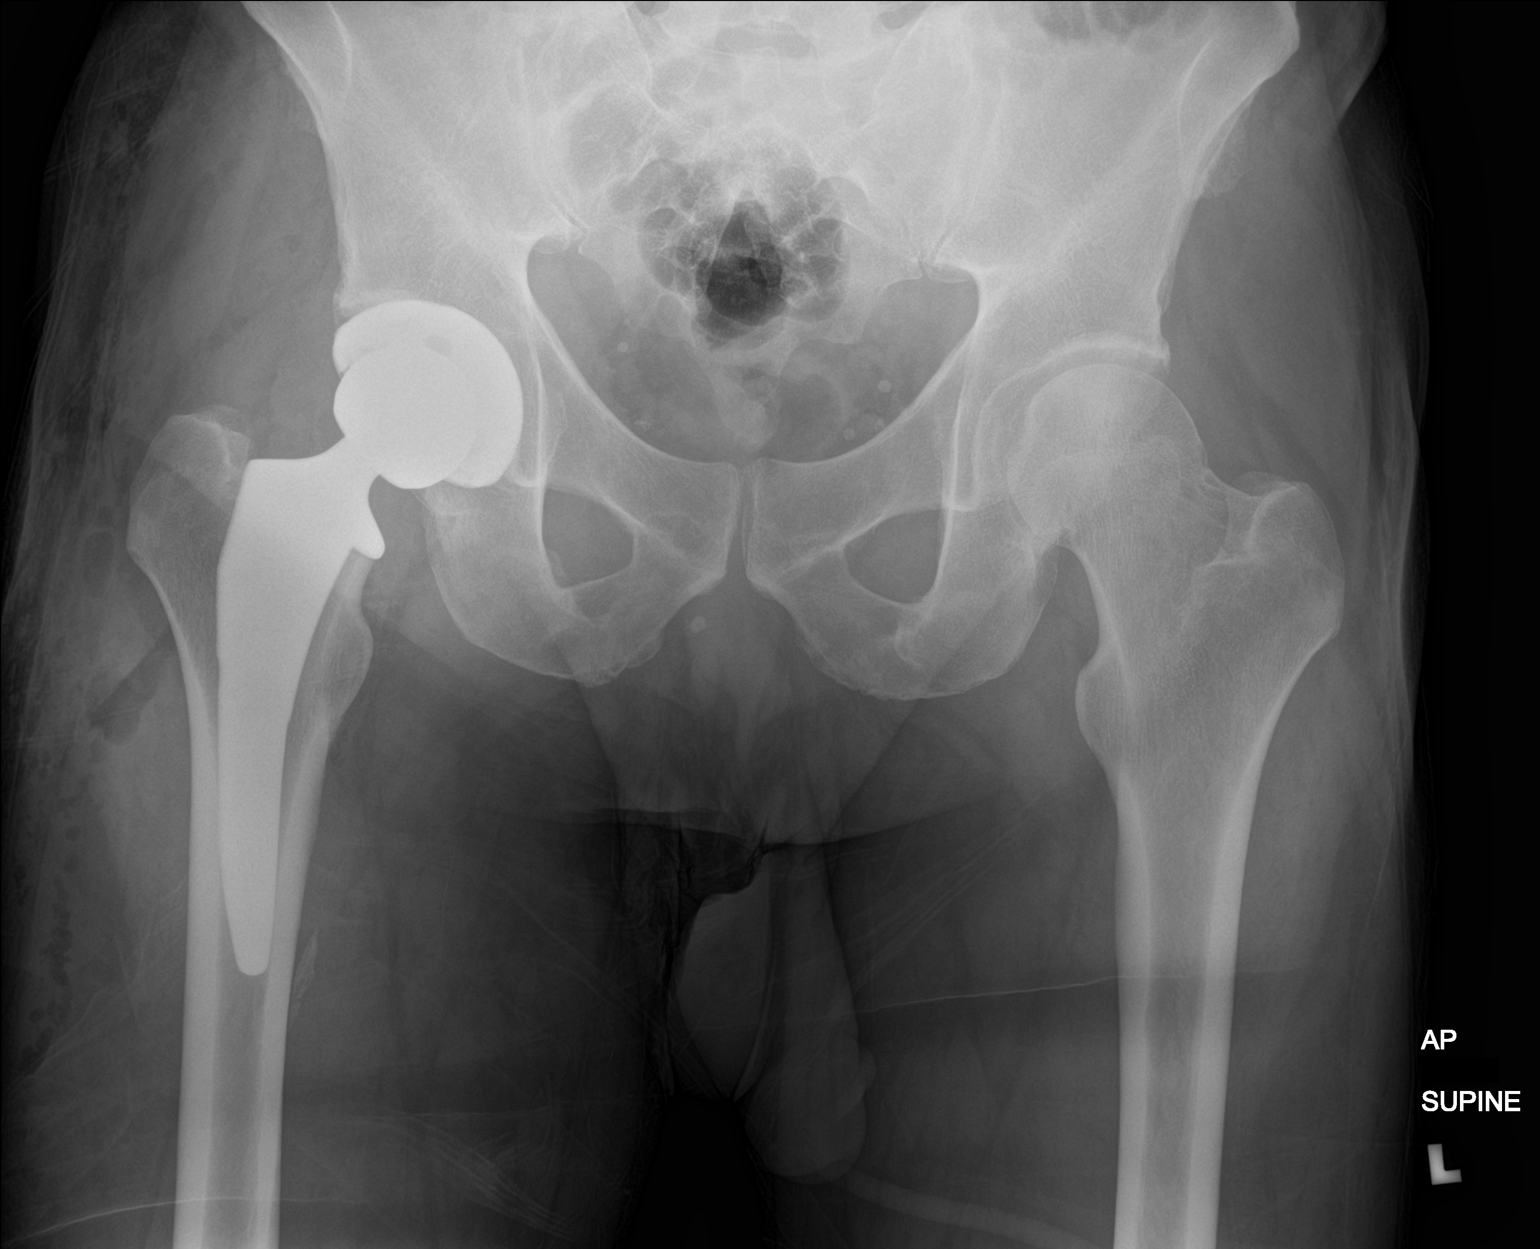

[1 of 1 positions shown; findings below may reference images not displayed]

FINDINGS: RIGHT hip prosthesis in expected position.

Osseous mineralization normal.

No fracture dislocation identified on single AP view.
IMPRESSION: RIGHT hip prosthesis without acute complication.

## 2024-02-16 ENCOUNTER — Ambulatory Visit: Attending: Cardiology | Admitting: Cardiology

## 2024-02-16 ENCOUNTER — Encounter: Payer: Self-pay | Admitting: Cardiology

## 2024-02-16 VITALS — BP 124/84 | HR 86 | Ht 70.0 in | Wt 163.0 lb

## 2024-02-16 DIAGNOSIS — Z13220 Encounter for screening for lipoid disorders: Secondary | ICD-10-CM | POA: Diagnosis not present

## 2024-02-16 DIAGNOSIS — I483 Typical atrial flutter: Secondary | ICD-10-CM | POA: Diagnosis not present

## 2024-02-16 DIAGNOSIS — I10 Essential (primary) hypertension: Secondary | ICD-10-CM

## 2024-02-16 DIAGNOSIS — Z131 Encounter for screening for diabetes mellitus: Secondary | ICD-10-CM | POA: Insufficient documentation

## 2024-02-16 DIAGNOSIS — R072 Precordial pain: Secondary | ICD-10-CM | POA: Diagnosis not present

## 2024-02-16 LAB — LIPID PANEL

## 2024-02-16 NOTE — Progress Notes (Signed)
 Cardiology Office Note:  .   Date:  02/16/2024  ID:  Anthony James, DOB 05/06/1966, MRN [id number] PCP: Valma Carwin, MD  [facility] Providers Cardiologist:  Newman Lawrence, MD PCP: Valma Carwin, MD  Chief Complaint  Patient presents with   Hypertension   Atrial Fibrillation     [name] is a 58 y.o. male with hypertension, paroxysmal atrial flutter   History of Present Illness  Patient had typical atrial flutter in 2015, that spontaneously converted to sinus rhythm while in the ED. There was a mention of ASD?PFO in the past, but echocardiogram in 2016 did not show either.   Patient has not had any recurrent palpitation symptoms, and there has been no documented atrial flutter since 2015.  Recently, patient has had left-sided chest pain/pressure that feels like gas.  His physical activity has been limited recently.  There is no clear correlation with physical exertion to his chest pain.     Vitals:   02/16/24 0858  BP: 124/84  Pulse: 86  SpO2: 98%      Review of Systems  Cardiovascular:  Positive for chest pain. Negative for dyspnea on exertion, leg swelling, palpitations and syncope.        Studies Reviewed: SABRA    EKG 02/16/2024: Sinus rhythm with frequent Premature ventricular complexes Moderate voltage criteria for LVH, may be normal variant ( Sokolow-Lyon , Cornell product ) T wave abnormality, consider inferior ischemia When compared with ECG of 10-Oct-2016 07:11, Premature ventricular complexes are now Present Vent. rate has increased BY  34 BPM     Risk Assessment/Calculations:    CHA2DS2-VASc Score = 1   This indicates a 0.6% annual risk of stroke. The patient's score is based upon: CHF History: 0 HTN History: 1 Diabetes History: 0 Stroke History: 0 Vascular Disease History: 0 Age Score: 0 Gender Score: 0        Physical Exam Vitals and nursing note reviewed.  Constitutional:      General: He is not in acute  distress. Neck:     Vascular: No JVD.  Cardiovascular:     Rate and Rhythm: Normal rate and regular rhythm.     Heart sounds: Normal heart sounds. No murmur heard. Pulmonary:     Effort: Pulmonary effort is normal.     Breath sounds: Normal breath sounds. No wheezing or rales.  Musculoskeletal:     Right lower leg: No edema.     Left lower leg: No edema.      VISIT DIAGNOSES:   ICD-10-CM   1. Typical atrial flutter (HCC)  I48.3 EKG 12-Lead    2. Essential hypertension  I10 EKG 12-Lead    CBC    Basic metabolic panel with GFR    3. Precordial pain  R07.2 ECHOCARDIOGRAM COMPLETE    CT CORONARY MORPH W/CTA COR W/SCORE W/CA W/CM &/OR WO/CM    4. Screening cholesterol level  Z13.220 Lipid panel    5. Screening for diabetes mellitus  Z13.1 HgB A1c       [name] is a 58 y.o. male with hypertension, paroxysmal atrial flutter, chest pain  Assessment & Plan  Chest pain: Not very typical of angina.  Abnormal EKG at baseline.  Recommend echocardiogram and coronary CT angiogram for definitive coronary anatomy evaluation. Check BMP, lipid panel today.  Atrial flutter: No documented occurrence since 2015.  No indication for anticoagulation at this time.   Hypertension:  Controlled.  If above test showed no significant abnormalities, I will see  him as needed.     Signed, Newman JINNY Lawrence, MD

## 2024-02-16 NOTE — Patient Instructions (Addendum)
 Lab Work: CBC BMP LIPID PANEL HGB A1C  If you have labs (blood work) drawn today and your tests are completely normal, you will receive your results only by: MyChart Message (if you have MyChart) OR A paper copy in the mail If you have any lab test that is abnormal or we need to change your treatment, we will call you to review the results.  Testing/Procedures: ECHO  Your physician has requested that you have an echocardiogram. Echocardiography is a painless test that uses sound waves to create images of your heart. It provides your doctor with information about the size and shape of your heart and how well your heart's chambers and valves are working. This procedure takes approximately one hour. There are no restrictions for this procedure. Please do NOT wear cologne, perfume, aftershave, or lotions (deodorant is allowed). Please arrive 15 minutes prior to your appointment time.  Please note: We ask at that you not bring children with you during ultrasound (echo/ vascular) testing. Due to room size and safety concerns, children are not allowed in the ultrasound rooms during exams. Our front office staff cannot provide observation of children in our lobby area while testing is being conducted. An adult accompanying a patient to their appointment will only be allowed in the ultrasound room at the discretion of the ultrasound technician under special circumstances. We apologize for any inconvenience.   CORONARY CTA  Your physician has requested that you have cardiac CT. Cardiac computed tomography (CT) is a painless test that uses an x-ray machine to take clear, detailed pictures of your heart. For further information please visit [email or web address] Please follow instruction sheet as given.        Your cardiac CT will be scheduled at :   [facility] [address]  [place], [place] [address]   Please enter the parking lot using the [address]  entrance and use the FREE valet service at the patient drop-off area. Enter the buidling and check-in with registration on the main floor.   Please follow these instructions carefully (unless otherwise directed):  An IV will be required for this test and Nitroglycerin will be given.  Hold all erectile dysfunction medications at least 3 days (72 hrs) prior to test. (Ie viagra, cialis, sildenafil, tadalafil, etc)   On the Night Before the Test: Be sure to Drink plenty of water. Do not consume any caffeinated/decaffeinated beverages or chocolate 12 hours prior to your test. Do not take any antihistamines 12 hours prior to your test.   On the Day of the Test: Drink plenty of water until 1 hour prior to the test. Do not eat any food 1 hour prior to test. You may take your regular medications prior to the test.  If you take Furosemide/Hydrochlorothiazide /Spironolactone/Chlorthalidone, please HOLD on the morning of the test. Patients who wear a continuous glucose monitor MUST remove the device prior to scanning.       After the Test: Drink plenty of water. After receiving IV contrast, you may experience a mild flushed feeling. This is normal. On occasion, you may experience a mild rash up to 24 hours after the test. This is not dangerous. If this occurs, you can take Benadryl  25 mg, Zyrtec, Claritin, or Allegra and increase your fluid intake. (Patients taking Tikosyn should avoid Benadryl , and may take Zyrtec, Claritin, or Allegra) If you experience trouble breathing, this can be serious. If it is severe call 911 IMMEDIATELY. If it is mild, please call  our office.  We will call to schedule your test 2-4 weeks out understanding that some insurance companies will need an authorization prior to the service being performed.   For more information and frequently asked questions, please visit our website : [email or web address]  For non-scheduling related questions, please contact the  cardiac imaging nurse navigator should you have any questions/concerns: Cardiac Imaging Nurse Navigators Direct Office Dial: [phone]   For scheduling needs, including cancellations and rescheduling, please call [place], [phone].   Follow-Up: At [facility], you and your health needs are our priority.  As part of our continuing mission to provide you with exceptional heart care, our providers are all part of one team.  This team includes your primary Cardiologist (physician) and Advanced Practice Providers or APPs (Physician Assistants and Nurse Practitioners) who all work together to provide you with the care you need, when you need it.  Your next appointment:   AS NEEDED  Provider:   Newman JINNY Lawrence, MD

## 2024-02-17 ENCOUNTER — Ambulatory Visit: Payer: Self-pay

## 2024-02-17 DIAGNOSIS — E782 Mixed hyperlipidemia: Secondary | ICD-10-CM

## 2024-02-17 DIAGNOSIS — I77810 Thoracic aortic ectasia: Secondary | ICD-10-CM

## 2024-02-17 LAB — CBC
Hematocrit: 38.1 % (ref 37.5–51.0)
Hemoglobin: 12.5 g/dL — ABNORMAL LOW (ref 13.0–17.7)
MCH: 34.8 pg — ABNORMAL HIGH (ref 26.6–33.0)
MCHC: 32.8 g/dL (ref 31.5–35.7)
MCV: 106 fL — ABNORMAL HIGH (ref 79–97)
Platelets: 220 x10E3/uL (ref 150–450)
RBC: 3.59 x10E6/uL — ABNORMAL LOW (ref 4.14–5.80)
RDW: 11.7 % (ref 11.6–15.4)
WBC: 6.1 x10E3/uL (ref 3.4–10.8)

## 2024-02-17 LAB — BASIC METABOLIC PANEL WITH GFR
BUN/Creatinine Ratio: 12 (ref 9–20)
BUN: 16 mg/dL (ref 6–24)
CO2: 23 mmol/L (ref 20–29)
Calcium: 9.3 mg/dL (ref 8.7–10.2)
Chloride: 102 mmol/L (ref 96–106)
Creatinine, Ser: 1.32 mg/dL — AB (ref 0.76–1.27)
Glucose: 91 mg/dL (ref 70–99)
Potassium: 4.2 mmol/L (ref 3.5–5.2)
Sodium: 141 mmol/L (ref 134–144)
eGFR: 63 mL/min/1.73 (ref >59)

## 2024-02-17 LAB — LIPID PANEL
Cholesterol, Total: 186 mg/dL (ref 100–199)
HDL: 40 mg/dL (ref >39)
LDL CALC COMMENT:: 4.7 ratio (ref 0.0–5.0)
LDL Chol Calc (NIH): 66 mg/dL (ref 0–99)
Triglycerides: 519 mg/dL — AB (ref 0–149)
VLDL Cholesterol Cal: 80 mg/dL — AB (ref 5–40)

## 2024-02-17 LAB — HEMOGLOBIN A1C
Est. average glucose Bld gHb Est-mCnc: 88 mg/dL
Hgb A1c MFr Bld: 4.7 — AB (ref 4.8–5.6)

## 2024-02-17 NOTE — Progress Notes (Signed)
 Mild but stable anemia.  Kidney function mildly reduced compared to 2 years ago.  Overall, this can be followed up with Dr. Valma.   Triglycerides were significantly elevated.  This could be falsely elevated if patient was not fasting at the time of the labs.  Reduce intake of simple carbohydrates such as white rice, white bread, sugar, as well as saturated fats. Recommend repeat fasting lipid panel in 1 month.   Thanks MJP

## 2024-02-18 ENCOUNTER — Encounter (HOSPITAL_COMMUNITY): Payer: Self-pay

## 2024-02-20 ENCOUNTER — Telehealth (HOSPITAL_COMMUNITY): Payer: Self-pay | Admitting: Emergency Medicine

## 2024-02-20 NOTE — Telephone Encounter (Signed)
 Reaching out to patient to offer assistance regarding upcoming cardiac imaging study; pt verbalizes understanding of appt date/time, parking situation and where to check in, pre-test NPO status and medications ordered, and verified current allergies; name and call back number provided for further questions should they arise Rockwell Alexandria RN Navigator Cardiac Imaging [facility] [phone] office [phone] cell

## 2024-02-23 ENCOUNTER — Ambulatory Visit (HOSPITAL_COMMUNITY)
Admission: RE | Admit: 2024-02-23 | Discharge: 2024-02-23 | Disposition: A | Discharge status: Home / Self Care | Source: Ambulatory Visit | Attending: Cardiology | Admitting: Cardiology

## 2024-02-23 DIAGNOSIS — I2510 Atherosclerotic heart disease of native coronary artery without angina pectoris: Secondary | ICD-10-CM | POA: Insufficient documentation

## 2024-02-23 DIAGNOSIS — R072 Precordial pain: Secondary | ICD-10-CM | POA: Diagnosis present

## 2024-02-23 MED ORDER — NITROGLYCERIN 0.4 MG SL SUBL
0.8000 mg | SUBLINGUAL_TABLET | Freq: Once | SUBLINGUAL | Status: AC
  Administered 2024-02-23: 0.8 mg via SUBLINGUAL

## 2024-02-23 MED ORDER — IOHEXOL 350 MG/ML SOLN
100.0000 mL | Freq: Once | INTRAVENOUS | Status: AC | PRN
  Administered 2024-02-23: 100 mL via INTRAVENOUS

## 2024-02-23 NOTE — Progress Notes (Signed)
 Patient presents for cardiac CT.  IV extravasation in R AC.   Approx 60cc of contrast.   Patient denies pain, +2 radial pulse, patient denies numbness or tingling.    Ice and pressure dressing applied.   Precautions discussed with patient and education sheet provided, patient verbalized understanding.  Dr. Kate made aware.   IV restarted and CT completed.

## 2024-02-23 NOTE — Progress Notes (Signed)
 MyChart message containing providers result note and interpretation read by patient on: Last read by Aliene Moats at 3:31PM on 02/23/2024.

## 2024-02-24 ENCOUNTER — Telehealth (HOSPITAL_COMMUNITY): Payer: Self-pay | Admitting: *Deleted

## 2024-02-24 NOTE — Telephone Encounter (Signed)
 Called patient to follow up on IV extravasation from CT scan yesterday.    Patient denies redness, numbness/tingling, loss of sensation in extremity.   Denies increase in pain.  Patient reports area is still swollen/edematous.   Advised to continue to elevate, use ice/heat for discomfort.  Patient verbalized understanding.

## 2024-02-24 NOTE — Progress Notes (Signed)
 Mild nonobstructive coronary artery disease, but with high amount of calcification. LDL very well-controlled, but triglyceride is elevated. Recommend increasing Lipitor to 40 mg daily. Recheck fasting lipid panel in September, along with A1c (diagnosis: Elevated triglyceride)  Thanks MJP

## 2024-02-25 MED ORDER — ATORVASTATIN CALCIUM 40 MG PO TABS: 40.0000 mg | ORAL_TABLET | Freq: Every day | ORAL | 3 refills | Status: AC

## 2024-03-24 ENCOUNTER — Ambulatory Visit (HOSPITAL_COMMUNITY)
Admission: RE | Admit: 2024-03-24 | Discharge: 2024-03-24 | Disposition: A | Discharge status: Home / Self Care | Source: Ambulatory Visit | Attending: Internal Medicine | Admitting: Internal Medicine

## 2024-03-24 DIAGNOSIS — R072 Precordial pain: Secondary | ICD-10-CM | POA: Diagnosis not present

## 2024-03-24 LAB — ECHOCARDIOGRAM COMPLETE
Area-P 1/2: 3.99 cm2
S' Lateral: 3.1 cm

## 2024-03-24 NOTE — Progress Notes (Signed)
 Aortic root measured 4.3 on echocardiogram but reported normal on CTA. There may be some discrepancy. Recommend repeat echocardiogram in 1 year to monitor this. (Diagnosis: aortic root dilatation)  Thanks MJP

## 2024-04-13 ENCOUNTER — Other Ambulatory Visit (HOSPITAL_COMMUNITY): Payer: Self-pay

## 2024-05-24 ENCOUNTER — Encounter: Payer: Self-pay | Admitting: Radiology
